# Patient Record
Sex: Female | Born: 1955 | Race: Black or African American | Hispanic: No | Smoking: Never smoker
Health system: Southern US, Community
[De-identification: ages and names within clinical notes are randomized; demographics above are authoritative.]

## PROBLEM LIST (undated history)

## (undated) DIAGNOSIS — F329 Major depressive disorder, single episode, unspecified: Secondary | ICD-10-CM

## (undated) DIAGNOSIS — F410 Panic disorder [episodic paroxysmal anxiety] without agoraphobia: Secondary | ICD-10-CM

## (undated) DIAGNOSIS — R42 Dizziness and giddiness: Secondary | ICD-10-CM

## (undated) DIAGNOSIS — K635 Polyp of colon: Secondary | ICD-10-CM

## (undated) DIAGNOSIS — Z8619 Personal history of other infectious and parasitic diseases: Secondary | ICD-10-CM

## (undated) DIAGNOSIS — F32A Depression, unspecified: Secondary | ICD-10-CM

## (undated) DIAGNOSIS — D649 Anemia, unspecified: Secondary | ICD-10-CM

## (undated) HISTORY — DX: Personal history of other infectious and parasitic diseases: Z86.19

## (undated) HISTORY — DX: Anemia, unspecified: D64.9

## (undated) HISTORY — PX: COLONOSCOPY: SHX174

## (undated) HISTORY — DX: Panic disorder (episodic paroxysmal anxiety): F41.0

## (undated) HISTORY — DX: Depression, unspecified: F32.A

## (undated) HISTORY — DX: Major depressive disorder, single episode, unspecified: F32.9

## (undated) HISTORY — DX: Polyp of colon: K63.5

---

## 2005-06-12 ENCOUNTER — Emergency Department: Payer: Self-pay | Admitting: Internal Medicine

## 2005-06-12 ENCOUNTER — Other Ambulatory Visit: Payer: Self-pay

## 2006-03-01 ENCOUNTER — Ambulatory Visit: Payer: Self-pay

## 2006-04-17 ENCOUNTER — Ambulatory Visit: Payer: Self-pay

## 2006-08-27 ENCOUNTER — Emergency Department (HOSPITAL_COMMUNITY): Admission: EM | Admit: 2006-08-27 | Discharge: 2006-08-27 | Payer: Self-pay | Admitting: Emergency Medicine

## 2008-01-16 HISTORY — PX: OTHER SURGICAL HISTORY: SHX169

## 2008-09-09 ENCOUNTER — Emergency Department: Payer: Self-pay | Admitting: Emergency Medicine

## 2008-10-11 ENCOUNTER — Ambulatory Visit: Payer: Self-pay | Admitting: Gastroenterology

## 2008-10-11 LAB — HM COLONOSCOPY

## 2008-10-12 ENCOUNTER — Ambulatory Visit: Payer: Self-pay | Admitting: Obstetrics and Gynecology

## 2010-10-30 LAB — WET PREP, GENITAL
Trich, Wet Prep: NONE SEEN
Yeast Wet Prep HPF POC: NONE SEEN

## 2010-10-30 LAB — COMPREHENSIVE METABOLIC PANEL
Alkaline Phosphatase: 36 — ABNORMAL LOW
BUN: 5 — ABNORMAL LOW
Calcium: 9
Creatinine, Ser: 0.77
Glucose, Bld: 81
Potassium: 3.9
Total Protein: 7

## 2010-10-30 LAB — CBC
HCT: 34.7 — ABNORMAL LOW
Hemoglobin: 11.3 — ABNORMAL LOW
MCHC: 32.5
MCV: 77 — ABNORMAL LOW
RDW: 16.1 — ABNORMAL HIGH

## 2010-10-30 LAB — URINALYSIS, ROUTINE W REFLEX MICROSCOPIC
Bilirubin Urine: NEGATIVE
Glucose, UA: NEGATIVE
Ketones, ur: NEGATIVE
pH: 5.5

## 2010-10-30 LAB — DIFFERENTIAL
Basophils Relative: 1
Lymphocytes Relative: 53 — ABNORMAL HIGH
Monocytes Relative: 8
Neutro Abs: 1.4 — ABNORMAL LOW
Neutrophils Relative %: 37 — ABNORMAL LOW

## 2010-10-30 LAB — GC/CHLAMYDIA PROBE AMP, GENITAL: GC Probe Amp, Genital: NEGATIVE

## 2011-03-16 HISTORY — PX: OTHER SURGICAL HISTORY: SHX169

## 2012-03-17 HISTORY — PX: OTHER SURGICAL HISTORY: SHX169

## 2012-04-23 ENCOUNTER — Ambulatory Visit: Payer: Self-pay | Admitting: Family Medicine

## 2012-07-25 ENCOUNTER — Ambulatory Visit: Payer: Self-pay | Admitting: Emergency Medicine

## 2012-07-28 ENCOUNTER — Ambulatory Visit: Payer: Self-pay | Admitting: Emergency Medicine

## 2012-08-21 ENCOUNTER — Other Ambulatory Visit: Payer: Self-pay | Admitting: Urgent Care

## 2012-10-23 ENCOUNTER — Ambulatory Visit: Payer: Self-pay | Admitting: Physician Assistant

## 2014-07-23 ENCOUNTER — Emergency Department
Admission: EM | Admit: 2014-07-23 | Discharge: 2014-07-23 | Disposition: A | Discharge status: Home / Self Care | Payer: BLUE CROSS/BLUE SHIELD | Attending: Emergency Medicine | Admitting: Emergency Medicine

## 2014-07-23 ENCOUNTER — Other Ambulatory Visit: Payer: Self-pay

## 2014-07-23 DIAGNOSIS — M545 Low back pain, unspecified: Secondary | ICD-10-CM

## 2014-07-23 LAB — COMPREHENSIVE METABOLIC PANEL
ALT: 20 U/L (ref 14–54)
ANION GAP: 9 (ref 5–15)
AST: 23 U/L (ref 15–41)
Albumin: 3.8 g/dL (ref 3.5–5.0)
Alkaline Phosphatase: 53 U/L (ref 38–126)
BUN: 11 mg/dL (ref 6–20)
CHLORIDE: 104 mmol/L (ref 101–111)
CO2: 25 mmol/L (ref 22–32)
Calcium: 8.7 mg/dL — ABNORMAL LOW (ref 8.9–10.3)
Creatinine, Ser: 0.69 mg/dL (ref 0.44–1.00)
GFR calc non Af Amer: >60 mL/min (ref >60)
GLUCOSE: 96 mg/dL (ref 65–99)
POTASSIUM: 3.8 mmol/L (ref 3.5–5.1)
Sodium: 138 mmol/L (ref 135–145)
Total Bilirubin: 0.4 mg/dL (ref 0.3–1.2)
Total Protein: 7.4 g/dL (ref 6.5–8.1)

## 2014-07-23 LAB — URINALYSIS COMPLETE WITH MICROSCOPIC (ARMC ONLY)
BACTERIA UA: NONE SEEN
BILIRUBIN URINE: NEGATIVE
GLUCOSE, UA: NEGATIVE mg/dL
HGB URINE DIPSTICK: NEGATIVE
Ketones, ur: NEGATIVE mg/dL
Leukocytes, UA: NEGATIVE
NITRITE: NEGATIVE
Protein, ur: NEGATIVE mg/dL
Specific Gravity, Urine: 1.015 (ref 1.005–1.030)
pH: 5 (ref 5.0–8.0)

## 2014-07-23 LAB — CBC WITH DIFFERENTIAL/PLATELET
BASOS ABS: 0 10*3/uL (ref 0–0.1)
BASOS PCT: 0 %
EOS ABS: 0 10*3/uL (ref 0–0.7)
Eosinophils Relative: 0 %
HCT: 36.9 % (ref 35.0–47.0)
Hemoglobin: 11.7 g/dL — ABNORMAL LOW (ref 12.0–16.0)
Lymphocytes Relative: 37 %
Lymphs Abs: 1.6 10*3/uL (ref 1.0–3.6)
MCH: 23.7 pg — AB (ref 26.0–34.0)
MCHC: 31.7 g/dL — AB (ref 32.0–36.0)
MCV: 74.7 fL — AB (ref 80.0–100.0)
MONOS PCT: 6 %
Monocytes Absolute: 0.3 10*3/uL (ref 0.2–0.9)
NEUTROS PCT: 57 %
Neutro Abs: 2.4 10*3/uL (ref 1.4–6.5)
PLATELETS: 249 10*3/uL (ref 150–440)
RBC: 4.94 MIL/uL (ref 3.80–5.20)
RDW: 16.5 % — ABNORMAL HIGH (ref 11.5–14.5)
WBC: 4.3 10*3/uL (ref 3.6–11.0)

## 2014-07-23 LAB — LIPASE, BLOOD: Lipase: 28 U/L (ref 22–51)

## 2014-07-23 LAB — TROPONIN I

## 2014-07-23 MED ORDER — NAPROXEN 500 MG PO TABS: 500.0000 mg | ORAL_TABLET | Freq: Two times a day (BID) | ORAL | Status: DC

## 2014-07-23 MED ORDER — KETOROLAC TROMETHAMINE 30 MG/ML IJ SOLN
INTRAMUSCULAR | Status: AC
  Administered 2014-07-23: 30 mg via INTRAMUSCULAR
  Filled 2014-07-23: qty 1

## 2014-07-23 NOTE — Discharge Instructions (Signed)

## 2014-07-23 NOTE — ED Notes (Signed)
Pt states that 2 days ago she started with abd pain and chest pain with some tingling to the left arm. Pt states that now she is having lower back pain at her belt line, states that she has back pain flare ups and just started a new job and thinks the pain maybe related to that. Pt states that she cont to have belly pain, that it is sore from where it was hurting earlier

## 2014-07-23 NOTE — ED Provider Notes (Signed)
[facility] Emergency Department Provider Note  ____________________________________________  Time seen: 4:45 PM  I have reviewed the triage vital signs and the nursing notes.   HISTORY  Chief Complaint Back pain   HPI Norma Bryant is a 59 y.o. female who presents with back pain. She reports 4 days ago she had discomfort in her abdomen which is now resolved. She blames the moderate aching in her lower backs on lifting heavy items at work. She thinks she has exacerbated her scoliosis and which is causing her back pain. She reports typically gets better after resting for a few days but it has not this time. She denies weakness or tingling in her lower extremities. No fevers no chills. She has no abdominal pain. She has no diarrhea. She has no incontinence. No IV drug abuse. No history of AAA     No past medical history on file.  There are no active problems to display for this patient.   No past surgical history on file.  Current Outpatient Rx  Name  Route  Sig  Dispense  Refill  . acetaminophen (TYLENOL) 325 MG tablet   Oral   Take 650 mg by mouth every 6 (six) hours as needed.           Allergies Review of patient's allergies indicates no known allergies.  No family history on file.  Social History History  Substance Use Topics  . Smoking status: Not on file  . Smokeless tobacco: Not on file  . Alcohol Use: Not on file   no smoking, no alcohol  Review of Systems  Constitutional: Negative for fever. Eyes: Negative for visual changes. ENT: Negative for sore throat Cardiovascular: Patient denies chest pain Respiratory: Negative for shortness of breath. Gastrointestinal: Negative for abdominal pain, vomiting and diarrhea. Genitourinary: Negative for dysuria. Negative for frequency Musculoskeletal: Positive for back pain Skin: Negative for rash. Neurological: Negative for headaches or focal weakness   10-point ROS otherwise  negative.  ____________________________________________   PHYSICAL EXAM:  VITAL SIGNS: ED Triage Vitals  Enc Vitals Group     BP 07/23/14 1419 141/91 mmHg     Pulse Rate 07/23/14 1419 71     Resp 07/23/14 1419 18     Temp 07/23/14 1419 98.5 F (36.9 C)     Temp Source 07/23/14 1419 Oral     SpO2 07/23/14 1419 97 %     Weight 07/23/14 1419 208 lb (94.348 kg)     Height 07/23/14 1419 5\' 4"  (1.626 m)     Head Cir --      Peak Flow --      Pain Score 07/23/14 1420 9     Pain Loc --      Pain Edu? --      Excl. in [place]? --      Constitutional: Alert and oriented. Well appearing and in no distress. Eyes: Conjunctivae are normal.  ENT   Head: Normocephalic and atraumatic.   Mouth/Throat: Mucous membranes are moist. Cardiovascular: Normal rate, regular rhythm. Normal and symmetric distal pulses are present in all extremities. No murmurs, rubs, or gallops. Respiratory: Normal respiratory effort without tachypnea nor retractions. Breath sounds are clear and equal bilaterally.  Gastrointestinal: Soft and non-tender in all quadrants. No distention. There is no CVA tenderness. No pulsatile mass, no bruit Genitourinary: deferred Musculoskeletal: Nontender with normal range of motion in all extremities. No lower extremity tenderness nor edema. No vertebral tenderness. Left-sided inferior paraspinal tenderness to palpation consistent with  muscle spasm. No erythema or bruising Neurologic:  Normal speech and language. No gross focal neurologic deficits are appreciated. Skin:  Skin is warm, dry and intact. No rash noted. Psychiatric: Mood and affect are normal. Patient exhibits appropriate insight and judgment.  ____________________________________________    LABS (pertinent positives/negatives)  Labs Reviewed  CBC WITH DIFFERENTIAL/PLATELET - Abnormal; Notable for the following:    Hemoglobin 11.7 (*)    MCV 74.7 (*)    MCH 23.7 (*)    MCHC 31.7 (*)    RDW 16.5 (*)    All  other components within normal limits  COMPREHENSIVE METABOLIC PANEL - Abnormal; Notable for the following:    Calcium 8.7 (*)    All other components within normal limits  LIPASE, BLOOD  TROPONIN I  URINALYSIS COMPLETEWITH MICROSCOPIC ([facility] ONLY)    ____________________________________________   EKG  ED ECG REPORT I, Lavonia Drafts, the attending physician, personally viewed and interpreted this ECG.  Date: 07/23/2014 EKG Time: 3:04 PM Rate: 60 Rhythm: normal sinus rhythm QRS Axis: normal Intervals: normal ST/T Wave abnormalities: normal Conduction Disutrbances: none Narrative Interpretation: unremarkable   ____________________________________________    RADIOLOGY I have personally reviewed any xrays that were ordered on this patient: None  ____________________________________________   PROCEDURES  Procedure(s) performed: none  Critical Care performed: none  ____________________________________________   INITIAL IMPRESSION / ASSESSMENT AND PLAN / ED COURSE  Pertinent labs & imaging results that were available during my care of the patient were reviewed by me and considered in my medical decision making (see chart for details).  Patient well-appearing, no acute distress. No fevers no chills. No abdominal tenderness to palpation. Exam is consistent with muscle spasm and in fact the patient moves or bends she complains of pain in her left lower back. We will treat with Toradol IM and also check a urinalysis. Her labs are unremarkable  ----------------------------------------- 7:01 PM on 07/23/2014 -----------------------------------------  Patient reports feeling significant better. Ambulated in the room easily. She reports she has a history of chronic back pain that she must have a flare. I have given her return cautions and have asked her to follow up with her primary care physician. We will start her on  NSAIDs  ____________________________________________   FINAL CLINICAL IMPRESSION(S) / ED DIAGNOSES  Final diagnoses:  Left-sided low back pain without sciatica     Lavonia Drafts, MD 07/23/14 1901

## 2015-02-17 DIAGNOSIS — IMO0002 Reserved for concepts with insufficient information to code with codable children: Secondary | ICD-10-CM | POA: Insufficient documentation

## 2015-02-17 DIAGNOSIS — F329 Major depressive disorder, single episode, unspecified: Secondary | ICD-10-CM | POA: Insufficient documentation

## 2015-02-17 DIAGNOSIS — M778 Other enthesopathies, not elsewhere classified: Secondary | ICD-10-CM | POA: Insufficient documentation

## 2015-02-17 DIAGNOSIS — Z86010 Personal history of colonic polyps: Secondary | ICD-10-CM | POA: Insufficient documentation

## 2015-02-17 DIAGNOSIS — F32A Depression, unspecified: Secondary | ICD-10-CM | POA: Insufficient documentation

## 2015-02-17 DIAGNOSIS — F410 Panic disorder [episodic paroxysmal anxiety] without agoraphobia: Secondary | ICD-10-CM | POA: Insufficient documentation

## 2015-02-17 DIAGNOSIS — D649 Anemia, unspecified: Secondary | ICD-10-CM | POA: Insufficient documentation

## 2015-02-17 DIAGNOSIS — D219 Benign neoplasm of connective and other soft tissue, unspecified: Secondary | ICD-10-CM | POA: Insufficient documentation

## 2015-02-18 ENCOUNTER — Encounter: Payer: Self-pay | Admitting: Family Medicine

## 2015-02-18 ENCOUNTER — Ambulatory Visit (INDEPENDENT_AMBULATORY_CARE_PROVIDER_SITE_OTHER): Payer: BLUE CROSS/BLUE SHIELD | Admitting: Family Medicine

## 2015-02-18 VITALS — BP 142/80 | HR 62 | Temp 97.8°F | Resp 16 | Ht 64.0 in | Wt 233.0 lb

## 2015-02-18 DIAGNOSIS — M545 Low back pain, unspecified: Secondary | ICD-10-CM | POA: Insufficient documentation

## 2015-02-18 DIAGNOSIS — M5442 Lumbago with sciatica, left side: Secondary | ICD-10-CM | POA: Diagnosis not present

## 2015-02-18 DIAGNOSIS — R635 Abnormal weight gain: Secondary | ICD-10-CM

## 2015-02-18 DIAGNOSIS — F329 Major depressive disorder, single episode, unspecified: Secondary | ICD-10-CM | POA: Diagnosis not present

## 2015-02-18 DIAGNOSIS — D649 Anemia, unspecified: Secondary | ICD-10-CM

## 2015-02-18 DIAGNOSIS — F32A Depression, unspecified: Secondary | ICD-10-CM

## 2015-02-18 MED ORDER — SERTRALINE HCL 25 MG PO TABS: ORAL_TABLET | ORAL | Status: DC

## 2015-02-18 MED ORDER — MELOXICAM 15 MG PO TABS: 15.0000 mg | ORAL_TABLET | Freq: Every day | ORAL | Status: DC | PRN

## 2015-02-18 NOTE — Progress Notes (Addendum)
Patient: Norma Bryant Female    DOB: 12/18/1955   60 y.o.   MRN: [id number] Visit Date: 02/18/2015  Today's Provider: Lelon Huh, MD   Chief Complaint  Patient presents with  . Back Pain    x 7 months   Subjective:    Back Pain This is a recurrent problem. Episode onset: 7 months ago. The problem occurs intermittently. The problem has been gradually worsening since onset. The pain is present in the lumbar spine. The quality of the pain is described as aching (sometimes a sharp pain). The pain radiates to the left thigh. The pain is moderate. The symptoms are aggravated by bending (also when lifting). Associated symptoms include leg pain and weakness (in legs from the knee down). Pertinent negatives include no abdominal pain, chest pain, dysuria or fever. She has tried bed rest and NSAIDs for the symptoms. The treatment provided moderate relief.   Patient was seen in the ER in 07/2014 for the same Back pain. Patient was given aToradol IM injection and treated with NSAIDS. Patient states she pain is not better today and it has worsened in the past few weeks.  Patient states 2 years ago she fell off a ladder and has had intermittent pain in her lower back ever since. She reports she had an MRI at that time showing scoliosis.   She also reports she hardly eats anything, but continues to gain weight. She does not get much exercise as she is limited by her back pain.   Depression: Patient states her Depression is worsening in the past few months. Patient feels stressed out and has a lot going on in her personal life. Patient currently does not take any medication for Depression but reports she has been prescribed medication several years ago. Her mood has been labile, sometimes very anxious and others depressed and crying. She is to the point she doesn't feel she can function at work due to stress. She is interested in trying another antidepressant.      No Known Allergies Previous  Medications   ACETAMINOPHEN (TYLENOL) 325 MG TABLET    Take 650 mg by mouth every 6 (six) hours as needed.   IBUPROFEN (ADVIL,MOTRIN) 800 MG TABLET    Take 800 mg by mouth every 6 (six) hours as needed.   NAPROXEN (NAPROSYN) 500 MG TABLET    Take 1 tablet (500 mg total) by mouth 2 (two) times daily with a meal.    Review of Systems  Constitutional: Positive for unexpected weight change. Negative for fever, chills, appetite change and fatigue.  Respiratory: Positive for cough. Negative for chest tightness and shortness of breath.   Cardiovascular: Positive for leg swelling (in left leg). Negative for chest pain and palpitations.  Gastrointestinal: Positive for diarrhea. Negative for nausea, vomiting and abdominal pain.  Genitourinary: Negative for dysuria, frequency, flank pain, enuresis and genital sores.  Musculoskeletal: Positive for back pain and arthralgias.  Neurological: Positive for weakness (in legs from the knee down). Negative for dizziness.  Psychiatric/Behavioral: Positive for decreased concentration.    Social History  Substance Use Topics  . Smoking status: Never Smoker   . Smokeless tobacco: Not on file  . Alcohol Use: No   Objective:   BP 142/80 mmHg  Pulse 62  Temp(Src) 97.8 F (36.6 C) (Oral)  Resp 16  Ht 5\' 4"  (1.626 m)  Wt 233 lb (105.688 kg)  BMI 39.97 kg/m2  SpO2 98%  Physical Exam   General  Appearance:    Alert, cooperative, no distress  Psych   Emotionally labile, often crying. Appropriate reasoning and judgement. Some psychomotor retardation noted.   Neurologic:   Awake, alert, oriented x 3. No apparent focal neurological           defect.   MS:     Moderate tenderness over axial lumbar spine. Negative straight leg test.        Assessment & Plan:     1. Midline low back pain with left-sided sciatica Likely exacerbated by weight gain and depression. Will send for records of previous MRI - meloxicam (MOBIC) 15 MG tablet; Take 1 tablet (15 mg  total) by mouth daily as needed for pain. Take with food  Dispense: 30 tablet; Refill: 1  2. Depression Start SSRI. She is interested in starting counseling.  - sertraline (ZOLOFT) 25 MG tablet; 1/2 tablet daily for 6 days, then 1 tablet daily for 6 days, then 2 tablets daily  Dispense: 30 tablet; Refill: 0 - Ambulatory referral to Psychology  3. Anemia, unspecified anemia type  - CBC  4. Abnormal weight gain  - Comprehensive metabolic panel - T4 AND TSH       Lelon Huh, MD  [facility]

## 2015-02-19 LAB — COMPREHENSIVE METABOLIC PANEL
ALK PHOS: 65 IU/L (ref 39–117)
ALT: 57 IU/L — ABNORMAL HIGH (ref 0–32)
AST: 35 IU/L (ref 0–40)
Albumin/Globulin Ratio: 1.7 (ref 1.1–2.5)
Albumin: 4.5 g/dL (ref 3.5–5.5)
BUN/Creatinine Ratio: 11 (ref 9–23)
BUN: 8 mg/dL (ref 6–24)
Bilirubin Total: 0.2 mg/dL (ref 0.0–1.2)
CO2: 27 mmol/L (ref 18–29)
CREATININE: 0.74 mg/dL (ref 0.57–1.00)
Calcium: 9.4 mg/dL (ref 8.7–10.2)
Chloride: 99 mmol/L (ref 96–106)
GFR calc Af Amer: 103 mL/min/{1.73_m2} (ref >59)
GFR calc non Af Amer: 90 mL/min/{1.73_m2} (ref >59)
GLOBULIN, TOTAL: 2.7 g/dL (ref 1.5–4.5)
GLUCOSE: 98 mg/dL (ref 65–99)
Potassium: 4.6 mmol/L (ref 3.5–5.2)
SODIUM: 142 mmol/L (ref 134–144)
Total Protein: 7.2 g/dL (ref 6.0–8.5)

## 2015-02-19 LAB — T4 AND TSH
T4 TOTAL: 7 ug/dL (ref 4.5–12.0)
TSH: 1.69 u[IU]/mL (ref 0.450–4.500)

## 2015-02-19 LAB — CBC
Hematocrit: 35.7 % (ref 34.0–46.6)
Hemoglobin: 11.4 g/dL (ref 11.1–15.9)
MCH: 24.1 pg — AB (ref 26.6–33.0)
MCHC: 31.9 g/dL (ref 31.5–35.7)
MCV: 75 fL — ABNORMAL LOW (ref 79–97)
NRBC: 0 % (ref 0–0)
PLATELETS: 319 10*3/uL (ref 150–379)
RBC: 4.74 x10E6/uL (ref 3.77–5.28)
RDW: 16 % — AB (ref 12.3–15.4)
WBC: 3.5 10*3/uL (ref 3.4–10.8)

## 2015-02-21 ENCOUNTER — Telehealth: Payer: Self-pay

## 2015-02-21 NOTE — Telephone Encounter (Signed)
Patient came to the office c/o of the medication that she was prescribed on 02/18/2015 (meloxicam and zoloft) is giving her diarrhea. She reports that she already has stomach issues, and one of the medications is making it worse. Advised Dr. Caryn Section of patient's symptoms, and he thinks that it could be coming from the Zoloft. He reports that it tends to have that side effect, but it will usually subside within a few days. L/M for patient to see if she could continue taking Zoloft for a few more days.

## 2015-02-21 NOTE — Telephone Encounter (Signed)
Patient reports that she will try to continue taking Zoloft for the next few days to see if symptoms subside. Patient also reports that she left a form for you to fill out for her employer up front, and she also wanted to know if you could extend her out of work note until her F/U appt with you on 03/04/15? Please advise. Thanks!

## 2015-02-22 NOTE — Telephone Encounter (Signed)
Pease advise.

## 2015-02-22 NOTE — Telephone Encounter (Signed)
Pt called back inquiring about her note.  She said she is suppose to see a therapist and wants to be out until she does that.   Thanks Con Memos

## 2015-02-23 NOTE — Telephone Encounter (Signed)
FMLA forms were completed 2-7 putting patient out through 03-04-15

## 2015-03-02 ENCOUNTER — Telehealth: Payer: Self-pay | Admitting: Family Medicine

## 2015-03-02 NOTE — Telephone Encounter (Signed)
Pt stated that she had spoke with Roshena earlier today and would like her to call back to discuss the form that was supposed to be faxed. Pt stated she didn't know what the form was for but that Roshena had called her earlier today and said she was going to fax it and she just wanted to get an update and find out what it was for. Thanks TNP

## 2015-03-02 NOTE — Telephone Encounter (Signed)
Spoke with patient and answered questions. Patient wanted to know if the forms that were completed an faxed, came from [place].

## 2015-03-03 NOTE — Telephone Encounter (Signed)
Pt would still like for you to call her back about the papers that you faxed.   Her call back is [phone]  Thanks Con Memos

## 2015-03-03 NOTE — Telephone Encounter (Signed)
Spoke with patient again. She wanted to know if she would be seeing Dr. Caryn Section tomorrow for an appointment or if it would be with someone else. Patient states she has questions about when she is to return to work because her job put her on the schedule this weekend. I advised patient that her appointment is tomorrow at 10am with Dr. Caryn Section, and she could ask him that tomorrow since he is out of the office this evening. Patient agreed and verbally voiced understanding.

## 2015-03-04 ENCOUNTER — Encounter: Payer: Self-pay | Admitting: Family Medicine

## 2015-03-04 ENCOUNTER — Ambulatory Visit (INDEPENDENT_AMBULATORY_CARE_PROVIDER_SITE_OTHER): Payer: BLUE CROSS/BLUE SHIELD | Admitting: Family Medicine

## 2015-03-04 ENCOUNTER — Ambulatory Visit
Admission: RE | Admit: 2015-03-04 | Discharge: 2015-03-04 | Disposition: A | Discharge status: Home / Self Care | Payer: BLUE CROSS/BLUE SHIELD | Source: Ambulatory Visit | Attending: Family Medicine | Admitting: Family Medicine

## 2015-03-04 VITALS — BP 130/90 | HR 74 | Temp 98.2°F | Resp 16 | Ht 64.0 in | Wt 233.0 lb

## 2015-03-04 DIAGNOSIS — M129 Arthropathy, unspecified: Secondary | ICD-10-CM | POA: Diagnosis not present

## 2015-03-04 DIAGNOSIS — M545 Low back pain, unspecified: Secondary | ICD-10-CM

## 2015-03-04 DIAGNOSIS — F32A Depression, unspecified: Secondary | ICD-10-CM

## 2015-03-04 DIAGNOSIS — D259 Leiomyoma of uterus, unspecified: Secondary | ICD-10-CM | POA: Insufficient documentation

## 2015-03-04 DIAGNOSIS — F329 Major depressive disorder, single episode, unspecified: Secondary | ICD-10-CM

## 2015-03-04 MED ORDER — SERTRALINE HCL 50 MG PO TABS: ORAL_TABLET | ORAL | Status: DC

## 2015-03-04 NOTE — Telephone Encounter (Signed)
Please advise 
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
## 2015-03-04 NOTE — Patient Instructions (Signed)
.   Go to the [facility] on [address] for low back Xray  .  

## 2015-03-04 NOTE — Telephone Encounter (Signed)
Pt stated she was in the office today and left forms for Dr. Caryn Section to complete and fax to her work place. Pt stated she was advised they would be done today and she just wanted to make sure it had been faxed. Pt stated it was her return to work information and if her job doesn't receive it today she will not be allowed to return to work on Monday. Pt would like a call back. Thanks TNP

## 2015-03-09 ENCOUNTER — Telehealth: Payer: Self-pay | Admitting: Family Medicine

## 2015-03-09 NOTE — Telephone Encounter (Signed)
Patient called back and requested results from x-ray's.

## 2015-03-09 NOTE — Telephone Encounter (Signed)
Pt would like her work note extended until she see Dr. Nicolasa Ducking on 05/26/15. Pt stated that after she saw her therapist Otila Kluver she was advised to stay out of work until her appt with Dr. Nicolasa Ducking. Pt will continue to see Otila Kluver until her appt on 05/26/15. Pt stated that it is due to her PTSD. Please advise. Pt would also, like to get her x ray results from 03/04/15. Thanks TNP

## 2015-03-17 ENCOUNTER — Ambulatory Visit: Payer: BLUE CROSS/BLUE SHIELD | Admitting: Gastroenterology

## 2015-03-25 ENCOUNTER — Ambulatory Visit: Payer: BLUE CROSS/BLUE SHIELD | Admitting: Family Medicine

## 2015-04-01 ENCOUNTER — Other Ambulatory Visit: Payer: Self-pay | Admitting: Sports Medicine

## 2015-04-01 DIAGNOSIS — M545 Low back pain: Secondary | ICD-10-CM

## 2015-04-07 ENCOUNTER — Ambulatory Visit
Admission: RE | Admit: 2015-04-07 | Discharge: 2015-04-07 | Disposition: A | Discharge status: Home / Self Care | Payer: BLUE CROSS/BLUE SHIELD | Source: Ambulatory Visit | Attending: Sports Medicine | Admitting: Sports Medicine

## 2015-04-07 DIAGNOSIS — M545 Low back pain: Secondary | ICD-10-CM

## 2015-04-15 ENCOUNTER — Encounter: Payer: Self-pay | Admitting: Family Medicine

## 2015-04-21 ENCOUNTER — Ambulatory Visit (INDEPENDENT_AMBULATORY_CARE_PROVIDER_SITE_OTHER): Payer: BLUE CROSS/BLUE SHIELD | Admitting: Gastroenterology

## 2015-04-21 ENCOUNTER — Other Ambulatory Visit: Payer: Self-pay

## 2015-04-21 ENCOUNTER — Encounter: Payer: Self-pay | Admitting: Gastroenterology

## 2015-04-21 DIAGNOSIS — M1990 Unspecified osteoarthritis, unspecified site: Secondary | ICD-10-CM | POA: Insufficient documentation

## 2015-04-21 DIAGNOSIS — R197 Diarrhea, unspecified: Secondary | ICD-10-CM

## 2015-04-21 DIAGNOSIS — R140 Abdominal distension (gaseous): Secondary | ICD-10-CM

## 2015-04-21 DIAGNOSIS — K30 Functional dyspepsia: Secondary | ICD-10-CM | POA: Insufficient documentation

## 2015-04-21 DIAGNOSIS — D509 Iron deficiency anemia, unspecified: Secondary | ICD-10-CM | POA: Diagnosis not present

## 2015-04-21 DIAGNOSIS — K219 Gastro-esophageal reflux disease without esophagitis: Secondary | ICD-10-CM | POA: Insufficient documentation

## 2015-04-21 NOTE — Progress Notes (Signed)
Primary Care Physician: Lelon Huh, MD  Primary Gastroenterologist:  Dr. Lucilla Lame  Chief Complaint  Patient presents with  . Diarrhea  . Abdominal Pain    HPI: Norma Bryant is a 60 y.o. female here for follow-up today. The patient reports that she has had chronic diarrhea and bloating with abdominal pain. The patient states that when she made the appointment she was having abdominal pain but it has since resolved. The patient denies any unexplained weight loss. The patient also reports that she has a lot of gurgling and sounds coming from her abdomen. The patient states that she has dairy products very regularly. She also reports that he has a history of colon polyps and was supposed to have a repeat colonoscopy. The patient was told she had H. pylori and states she was treated in the past but does not recall ever being retested to see if she had cleared the bacteria.  Current Outpatient Prescriptions  Medication Sig Dispense Refill  . naproxen (NAPROSYN) 500 MG tablet Take 1 tablet (500 mg total) by mouth 2 (two) times daily with a meal. 20 tablet 2  . sertraline (ZOLOFT) 50 MG tablet One tablet daily 30 tablet 1  . acetaminophen (TYLENOL) 325 MG tablet Take 650 mg by mouth every 6 (six) hours as needed. Reported on 04/21/2015    . ibuprofen (ADVIL,MOTRIN) 800 MG tablet Take 800 mg by mouth every 6 (six) hours as needed. Reported on 04/21/2015    . meloxicam (MOBIC) 15 MG tablet Take 1 tablet (15 mg total) by mouth daily as needed for pain. Take with food (Patient not taking: Reported on 04/21/2015) 30 tablet 1   No current facility-administered medications for this visit.    Allergies as of 04/21/2015  . (No Known Allergies)    ROS:  General: Negative for anorexia, weight loss, fever, chills, fatigue, weakness. ENT: Negative for hoarseness, difficulty swallowing , nasal congestion. CV: Negative for chest pain, angina, palpitations, dyspnea on exertion, peripheral edema.    Respiratory: Negative for dyspnea at rest, dyspnea on exertion, cough, sputum, wheezing.  GI: See history of present illness. GU:  Negative for dysuria, hematuria, urinary incontinence, urinary frequency, nocturnal urination.  Endo: Negative for unusual weight change.    Physical Examination:   There were no vitals taken for this visit.  General: Well-nourished, well-developed in no acute distress.  Eyes: No icterus. Conjunctivae pink. Mouth: Oropharyngeal mucosa moist and pink , no lesions erythema or exudate. Lungs: Clear to auscultation bilaterally. Non-labored. Heart: Regular rate and rhythm, no murmurs rubs or gallops.  Abdomen: Bowel sounds are normal, nontender, nondistended, no hepatosplenomegaly or masses, no abdominal bruits or hernia , no rebound or guarding.   Extremities: No lower extremity edema. No clubbing or deformities. Neuro: Alert and oriented x 3.  Grossly intact. Skin: Warm and dry, no jaundice.   Psych: Alert and cooperative, normal mood and affect.  Labs:    Imaging Studies: Mr Lumbar Spine Wo Contrast  04/08/2015  CLINICAL DATA:  Low back pain without sciatica. EXAM: MRI LUMBAR SPINE WITHOUT CONTRAST TECHNIQUE: Multiplanar, multisequence MR imaging of the lumbar spine was performed. No intravenous contrast was administered. COMPARISON:  None. FINDINGS: As requested, the scan extended from T9 through the sacrum. Normal alignment. Negative for fracture. No mass lesion. No compression of the left lower thoracic spinal cord. Conus medullaris normal and terminates at L1-2. Bilateral renal cysts. T11-12: Mild disc degeneration T12-L1:  Mild disc degeneration L1-2:  Negative L2-3:  Negative  L3-4: Mild disc and facet degeneration without spinal or foraminal stenosis. L4-5: Mild disc and facet degeneration without spinal or foraminal stenosis L5-S1:  Mild facet degeneration without stenosis. IMPRESSION: Negative for fracture in the lower thoracic or lumbar spine Mild lumbar  degenerative change without disc protrusion or spinal stenosis. Electronically Signed   By: Franchot Gallo M.D.   On: 04/08/2015 08:35    Assessment and Plan:   Norma Bryant is a 60 y.o. y/o female comes in today with a history of H. pylori and reports that she is in need of a repeat colonoscopy due to polyps. The patient also has bloating with diarrhea. The patient will be set up for a H. pylori stool test and will be set up for repeat colonoscopy. She has also been told to avoid dairy products for 2 weeks to see if her symptoms improve.I have discussed risks & benefits which include, but are not limited to, bleeding, infection, perforation & drug reaction.  The patient agrees with this plan & written consent will be obtained.      Note: This dictation was prepared with Dragon dictation along with smaller phrase technology. Any transcriptional errors that result from this process are unintentional.

## 2015-04-22 ENCOUNTER — Other Ambulatory Visit: Payer: Self-pay

## 2015-04-22 LAB — CBC WITH DIFFERENTIAL/PLATELET
BASOS ABS: 0 10*3/uL (ref 0.0–0.2)
BASOS: 0 %
EOS (ABSOLUTE): 0.1 10*3/uL (ref 0.0–0.4)
Eos: 2 %
HEMOGLOBIN: 11.2 g/dL (ref 11.1–15.9)
Hematocrit: 36.3 % (ref 34.0–46.6)
IMMATURE GRANS (ABS): 0 10*3/uL (ref 0.0–0.1)
Immature Granulocytes: 0 %
LYMPHS ABS: 2 10*3/uL (ref 0.7–3.1)
Lymphs: 46 %
MCH: 23.3 pg — ABNORMAL LOW (ref 26.6–33.0)
MCHC: 30.9 g/dL — AB (ref 31.5–35.7)
MCV: 76 fL — ABNORMAL LOW (ref 79–97)
MONOS ABS: 0.4 10*3/uL (ref 0.1–0.9)
Monocytes: 9 %
NEUTROS ABS: 1.8 10*3/uL (ref 1.4–7.0)
Neutrophils: 43 %
PLATELETS: 295 10*3/uL (ref 150–379)
RBC: 4.81 x10E6/uL (ref 3.77–5.28)
RDW: 16.4 % — AB (ref 12.3–15.4)
WBC: 4.3 10*3/uL (ref 3.4–10.8)

## 2015-04-22 LAB — IRON AND TIBC
IRON: 39 ug/dL (ref 27–159)
Iron Saturation: 10 % — ABNORMAL LOW (ref 15–55)
Total Iron Binding Capacity: 401 ug/dL (ref 250–450)
UIBC: 362 ug/dL (ref 131–425)

## 2015-04-27 ENCOUNTER — Encounter: Payer: Self-pay | Admitting: *Deleted

## 2015-04-27 ENCOUNTER — Other Ambulatory Visit: Payer: Self-pay | Admitting: *Deleted

## 2015-04-27 DIAGNOSIS — F329 Major depressive disorder, single episode, unspecified: Secondary | ICD-10-CM

## 2015-04-27 DIAGNOSIS — F32A Depression, unspecified: Secondary | ICD-10-CM

## 2015-04-27 MED ORDER — SERTRALINE HCL 50 MG PO TABS: ORAL_TABLET | ORAL | Status: DC

## 2015-04-27 NOTE — Telephone Encounter (Signed)
Refill request for sertraline hcl 50 mg, x1 tab qd Last filled by MD on- 03/04/2015 #30 x1 refill Last Appt: 03/04/2015 Next Appt: none Please advise refill?   Requesting 90 day supply?

## 2015-04-28 ENCOUNTER — Telehealth: Payer: Self-pay

## 2015-04-28 ENCOUNTER — Encounter: Payer: Self-pay | Admitting: Gastroenterology

## 2015-04-28 ENCOUNTER — Encounter: Payer: Self-pay | Admitting: Anesthesiology

## 2015-04-28 LAB — H. PYLORI ANTIGEN, STOOL: H pylori Ag, Stl: NEGATIVE

## 2015-04-28 NOTE — Telephone Encounter (Signed)
-----   Message from Lucilla Lame, MD sent at 04/25/2015 12:13 PM EDT ----- Let the patient know the blood count is normal but her iron levels are low.

## 2015-04-28 NOTE — Telephone Encounter (Signed)
Pt notified of lab results

## 2015-05-02 ENCOUNTER — Other Ambulatory Visit: Payer: Self-pay | Admitting: Family Medicine

## 2015-05-02 ENCOUNTER — Other Ambulatory Visit: Payer: Self-pay | Admitting: *Deleted

## 2015-05-02 DIAGNOSIS — F329 Major depressive disorder, single episode, unspecified: Secondary | ICD-10-CM

## 2015-05-02 DIAGNOSIS — F32A Depression, unspecified: Secondary | ICD-10-CM

## 2015-05-02 MED ORDER — SERTRALINE HCL 50 MG PO TABS: ORAL_TABLET | ORAL | Status: DC

## 2015-05-02 NOTE — Telephone Encounter (Signed)
Requesting 90 day supply.

## 2015-05-03 ENCOUNTER — Telehealth: Payer: Self-pay | Admitting: Gastroenterology

## 2015-05-03 NOTE — Telephone Encounter (Signed)
Patient called and stated she cancelled her colonoscopy and would like you to call her and reschedule.

## 2015-05-03 NOTE — Telephone Encounter (Signed)
LVM for pt to return my call to reschedule colonoscopy.  

## 2015-05-04 NOTE — Discharge Instructions (Signed)

## 2015-05-05 ENCOUNTER — Ambulatory Visit
Admission: RE | Admit: 2015-05-05 | Payer: BLUE CROSS/BLUE SHIELD | Source: Ambulatory Visit | Admitting: Gastroenterology

## 2015-05-05 HISTORY — DX: Dizziness and giddiness: R42

## 2015-05-05 SURGERY — COLONOSCOPY WITH PROPOFOL
Anesthesia: Choice

## 2015-11-16 ENCOUNTER — Encounter (INDEPENDENT_AMBULATORY_CARE_PROVIDER_SITE_OTHER): Payer: Self-pay | Admitting: Sports Medicine

## 2015-11-16 ENCOUNTER — Ambulatory Visit (INDEPENDENT_AMBULATORY_CARE_PROVIDER_SITE_OTHER): Payer: BLUE CROSS/BLUE SHIELD

## 2015-11-16 ENCOUNTER — Ambulatory Visit (INDEPENDENT_AMBULATORY_CARE_PROVIDER_SITE_OTHER): Payer: BLUE CROSS/BLUE SHIELD | Admitting: Sports Medicine

## 2015-11-16 VITALS — BP 132/95 | HR 63 | Ht 64.0 in | Wt 224.0 lb

## 2015-11-16 DIAGNOSIS — G8929 Other chronic pain: Secondary | ICD-10-CM

## 2015-11-16 DIAGNOSIS — F4310 Post-traumatic stress disorder, unspecified: Secondary | ICD-10-CM | POA: Diagnosis not present

## 2015-11-16 DIAGNOSIS — M542 Cervicalgia: Secondary | ICD-10-CM

## 2015-11-16 DIAGNOSIS — M545 Low back pain: Secondary | ICD-10-CM

## 2015-11-16 MED ORDER — TRAMADOL HCL 50 MG PO TABS: 50.0000 mg | ORAL_TABLET | Freq: Four times a day (QID) | ORAL | 0 refills | Status: DC | PRN

## 2015-11-16 MED ORDER — GABAPENTIN 300 MG PO CAPS: ORAL_CAPSULE | ORAL | 1 refills | Status: DC

## 2015-11-16 NOTE — Progress Notes (Signed)
Norma Bryant - 60 y.o. female MRN [id number]  Date of birth: May 11, 1955  Office Visit Note: Visit Date: 11/16/2015 PCP: Lelon Huh, MD Referred by: Birdie Sons, MD  Subjective: Chief Complaint  Patient presents with  . Middle Back - Pain  . Left shoulder pain   HPI:  Patient states middle back pain on the right side and left shoulder pain.  Pain for about 4 weeks. Hurts to turn right to left and to do anything.    Pain radiates down into the right upper extremity greater than left. Pain with any type of cervical rotation or flexion/ extension.  Denies fevers, chills, recent weight gain or weight loss.  No night sweats. No significant nighttime awakenings due to this issue.  Pt denies any change in bowel or bladder habits, muscle weakness, numbness or falls associated with this pain.    ROS Otherwise per HPI.  Assessment & Plan: Visit Diagnoses:  1. Neck pain   2. PTSD (post-traumatic stress disorder)   3. Chronic midline low back pain without sciatica     Plan: Findings:  Symptoms consistent with bilateral radiculitis. Given underlying anxiety issues as well we'll trial of gabapentin & titrate up slowly. AAOS spine conditioning program provided.    Meds & Orders:  Meds ordered this encounter  Medications  . gabapentin (NEURONTIN) 300 MG capsule    Sig: Start with 1 tab po qhs X 1 week, then increase to 1 tab po bid X 1 week then 1 tab po prn    Dispense:  90 capsule    Refill:  1  . traMADol (ULTRAM) 50 MG tablet    Sig: Take 1 tablet (50 mg total) by mouth every 6 (six) hours as needed.    Dispense:  30 tablet    Refill:  0    Orders Placed This Encounter  Procedures  . XR Cervical Spine 2 or 3 views  . Ambulatory referral to Physical Therapy    Follow-up: Return in about 6 weeks (around 12/28/2015) for repeat clinical exam.   Procedures: No procedures performed  No notes on file   Clinical History: MRI Lumbar Spine 04/07/15: Mild lumbar  spondylosis without disc protrusion or stenosis  She reports that she has never smoked. She has never used smokeless tobacco. No results for input(s): HGBA1C, LABURIC in the last 8760 hours.  Objective:  VS:  HT:5\' 4"  (162.6 cm)   WT:224 lb (101.6 kg)  BMI:38.5    BP:(!) 132/95  HR:63bpm  TEMP: ( )  RESP:  Physical Exam  Constitutional: No distress.  HENT:  Head: Normocephalic and atraumatic.  Eyes: Right eye exhibits no discharge. Left eye exhibits no discharge. No scleral icterus.  Pulmonary/Chest: Effort normal. No respiratory distress.  Musculoskeletal:  Neck & shoulders overall well aligned. Rotator cuff strength intact. Upper extremity strength is symmetric & normal. Negative Hoffman's. Pain with brachial plexus squeeze as well as arm squeeze test on the left. Upper extremity dermatomes intact to light touch & symmetric. Negative Spurling's. Limited cervical side bending to the right. She has pain with straight leg raise bilaterally is localizes to the posterior leg not to the back. No radicular symptoms. Lower extremity strength is intact.  Neurological: She is alert.  Appropriately interactive.  Skin: Skin is warm and dry. No rash noted. She is not diaphoretic. No erythema. No pallor.  Psychiatric: Judgment normal.    Ortho Exam Imaging: No results found.  Past Medical/Family/Surgical/Social History: Medications & Allergies reviewed per  EMR Patient Active Problem List   Diagnosis Date Noted  . Arthritis 04/21/2015  . Acid indigestion 04/21/2015  . Esophageal reflux 04/21/2015  . Low back pain 02/18/2015  . Abnormal weight gain 02/18/2015  . Anemia 02/17/2015  . History of adenomatous polyp of colon 02/17/2015  . Cystocele 02/17/2015  . Depression 02/17/2015  . Fibroids 02/17/2015  . Panic attacks 02/17/2015  . Tendinitis of elbow or forearm 02/17/2015   Past Medical History:  Diagnosis Date  . Anemia   . Colon polyp   . Depression   . History of chicken pox     . History of measles   . History of mumps   . Panic attack   . Vertigo    no episodes over 1 yr   Family History  Problem Relation Age of Onset  . Congestive Heart Failure Mother   . Heart attack Mother     1st MI in her 67's  . Cirrhosis Father     of liver  . Alcohol abuse Father   . Diabetes Maternal Aunt    Past Surgical History:  Procedure Laterality Date  . colon polyp removal  2010   colonoscopy  . COLONOSCOPY    . Mammogram Screening  03/2011   [facility]  . pap smear  03/17/2012   [facility]   Social History   Occupational History  . Retail sales associate     Works for [employer]  . Smoking status: Never Smoker  . Smokeless tobacco: Never Used  . Alcohol use No  . Drug use: No  . Sexual activity: Not on file

## 2015-11-18 ENCOUNTER — Telehealth (INDEPENDENT_AMBULATORY_CARE_PROVIDER_SITE_OTHER): Payer: Self-pay | Admitting: Sports Medicine

## 2015-11-18 NOTE — Telephone Encounter (Signed)
Patient called advised the medication is not working and she need a note to be out of work until the medicine kicks in.  The number to contact he is [phone]

## 2015-11-18 NOTE — Telephone Encounter (Signed)
Okay to provide a note to be out of work until 11/23/15.  Then return with restrictions previously provided. If she is still having severe enough symptoms to warrant being out of work at that time she will need a earlier follow-up appointment to extend this any further.

## 2015-11-18 NOTE — Telephone Encounter (Signed)
Please see message below concerning a work note for patient. Please Advise.  Thank You

## 2015-11-23 ENCOUNTER — Telehealth (INDEPENDENT_AMBULATORY_CARE_PROVIDER_SITE_OTHER): Payer: Self-pay

## 2015-11-23 ENCOUNTER — Encounter (INDEPENDENT_AMBULATORY_CARE_PROVIDER_SITE_OTHER): Payer: Self-pay

## 2015-11-23 NOTE — Telephone Encounter (Signed)
Talked with patient and advised her of message concerning work note and when she returns to work to use her previous restrictions provided.

## 2015-11-23 NOTE — Telephone Encounter (Signed)
Talked with patient and advised her that forms were being completed and would be ready for pick up at the front desk.  Stated that she was not sure if she would need forms completed.  Advised her that I would go ahead and complete forms any way.  Faxed forms to [employer] at [phone].

## 2015-11-23 NOTE — Progress Notes (Signed)
   Norma Bryant - 60 y.o. female MRN [id number]  Date of birth: 02/17/1955  Office Visit Note: Visit Date: 11/23/2015 PCP: Lelon Huh, MD Referred by: No ref. provider found  Subjective: No chief complaint on file.  HPI: HPI  ROS Otherwise per HPI.  Assessment & Plan: Visit Diagnoses: No diagnosis found.  Plan: No additional findings.   Meds & Orders: No orders of the defined types were placed in this encounter.  No orders of the defined types were placed in this encounter.   Follow-up: No Follow-up on file.   Procedures: No procedures performed  No notes on file   Clinical History: MRI Lumbar Spine 04/07/15: Mild lumbar spondylosis without disc protrusion or stenosis  She reports that she has never smoked. She has never used smokeless tobacco. No results for input(s): HGBA1C, LABURIC in the last 8760 hours.  Objective:  VS:  HT:    WT:   BMI:     BP:   HR: bpm  TEMP: ( )  RESP:  Physical Exam  Ortho Exam Imaging: No results found.  Past Medical/Family/Surgical/Social History: Medications & Allergies reviewed per EMR Patient Active Problem List   Diagnosis Date Noted  . Arthritis 04/21/2015  . Acid indigestion 04/21/2015  . Esophageal reflux 04/21/2015  . Low back pain 02/18/2015  . Abnormal weight gain 02/18/2015  . Anemia 02/17/2015  . History of adenomatous polyp of colon 02/17/2015  . Cystocele 02/17/2015  . Depression 02/17/2015  . Fibroids 02/17/2015  . Panic attacks 02/17/2015  . Tendinitis of elbow or forearm 02/17/2015   Past Medical History:  Diagnosis Date  . Anemia   . Colon polyp   . Depression   . History of chicken pox   . History of measles   . History of mumps   . Panic attack   . Vertigo    no episodes over 1 yr   Family History  Problem Relation Age of Onset  . Congestive Heart Failure Mother   . Heart attack Mother     1st MI in her 9's  . Cirrhosis Father     of liver  . Alcohol abuse Father   . Diabetes Maternal  Aunt    Past Surgical History:  Procedure Laterality Date  . colon polyp removal  2010   colonoscopy  . COLONOSCOPY    . Mammogram Screening  03/2011   [facility]  . pap smear  03/17/2012   [facility]   Social History   Occupational History  . 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 Smoking status: Never Smoker  . Smokeless tobacco: Never Used  . Alcohol use No  . Drug use: No  . Sexual activity: Not on file

## 2015-11-29 ENCOUNTER — Telehealth (INDEPENDENT_AMBULATORY_CARE_PROVIDER_SITE_OTHER): Payer: Self-pay | Admitting: Radiology

## 2015-11-29 NOTE — Telephone Encounter (Signed)
Talked with patient and she would like to know if a work note could be done without the restrictions of no bending, stooping, or twisting. Please Advise. Thank You

## 2015-11-29 NOTE — Telephone Encounter (Signed)
Okay to provided as outlined above.

## 2015-11-29 NOTE — Telephone Encounter (Signed)
Pt called back about what this letter is supposed to say.

## 2015-11-29 NOTE — Telephone Encounter (Signed)
Patient calling today wanting to speak with April to know about a letter that was going to be written about her restrictions. Can you please call her back? CB# [phone]

## 2015-11-30 ENCOUNTER — Encounter (INDEPENDENT_AMBULATORY_CARE_PROVIDER_SITE_OTHER): Payer: Self-pay

## 2015-11-30 NOTE — Telephone Encounter (Signed)
Faxed updated work note to [phone].

## 2015-12-28 ENCOUNTER — Ambulatory Visit (INDEPENDENT_AMBULATORY_CARE_PROVIDER_SITE_OTHER): Payer: BLUE CROSS/BLUE SHIELD | Admitting: Sports Medicine

## 2016-01-20 ENCOUNTER — Encounter (INDEPENDENT_AMBULATORY_CARE_PROVIDER_SITE_OTHER): Payer: Self-pay | Admitting: *Deleted

## 2016-02-08 ENCOUNTER — Telehealth: Payer: Self-pay

## 2016-02-08 NOTE — Telephone Encounter (Signed)
Patient would like to schedule a screening colonoscopy with Dr. Allen Norris

## 2016-02-12 ENCOUNTER — Other Ambulatory Visit: Payer: Self-pay | Admitting: Family Medicine

## 2016-02-12 DIAGNOSIS — F32A Depression, unspecified: Secondary | ICD-10-CM

## 2016-02-12 DIAGNOSIS — F329 Major depressive disorder, single episode, unspecified: Secondary | ICD-10-CM

## 2016-02-13 ENCOUNTER — Telehealth: Payer: Self-pay

## 2016-02-13 NOTE — Telephone Encounter (Signed)
Patient called again wanting to know when she would receive a call from the nurse to schedule her colonoscopy. I told her that her order was in the work-que but that we only have one nurse calling patients. However, I told her that the nurse would call her as soon as she can get to her. Patient understood and had no further questions.

## 2016-02-14 ENCOUNTER — Other Ambulatory Visit: Payer: Self-pay

## 2016-02-14 NOTE — Telephone Encounter (Signed)
Colonoscopy at [facility] with Vicente Males on 03/01/16. Please precert for hx of colon polyps Z86.010

## 2016-02-14 NOTE — Telephone Encounter (Signed)
Gastroenterology Pre-Procedure Review  Request Date:  Requesting Physician: Dr.   PATIENT REVIEW QUESTIONS: The patient responded to the following health history questions as indicated:    1. Are you having any GI issues? no 2. Do you have a personal history of Polyps? yes (repeat 2 years) 3. Do you have a family history of Colon Cancer or Polyps? no 4. Diabetes Mellitus? no 5. Joint replacements in the past 12 months?no 6. Major health problems in the past 3 months?no 7. Any artificial heart valves, MVP, or defibrillator?no    MEDICATIONS & ALLERGIES:    Patient reports the following regarding taking any anticoagulation/antiplatelet therapy:   Plavix, Coumadin, Eliquis, Xarelto, Lovenox, Pradaxa, Brilinta, or Effient? no Aspirin? no  Patient confirms/reports the following medications:  Current Outpatient Prescriptions  Medication Sig Dispense Refill  . gabapentin (NEURONTIN) 300 MG capsule Start with 1 tab po qhs X 1 week, then increase to 1 tab po bid X 1 week then 1 tab po prn 90 capsule 1  . meloxicam (MOBIC) 15 MG tablet TAKE 1 TABLET BY MOUTH EVERY DAY AS NEEDED FOR PAIN WITH FOOD (Patient not taking: Reported on 11/16/2015) 30 tablet 3  . Multiple Vitamins-Minerals (CENTRUM ADULTS PO) Take by mouth daily.    . naproxen (NAPROSYN) 500 MG tablet Take 1 tablet (500 mg total) by mouth 2 (two) times daily with a meal. (Patient not taking: Reported on 11/16/2015) 20 tablet 2  . sertraline (ZOLOFT) 50 MG tablet TAKE 1 TABLET BY MOUTH DAILY 90 tablet 1  . traMADol (ULTRAM) 50 MG tablet Take 1 tablet (50 mg total) by mouth every 6 (six) hours as needed. 30 tablet 0   No current facility-administered medications for this visit.     Patient confirms/reports the following allergies:  No Known Allergies  No orders of the defined types were placed in this encounter.   AUTHORIZATION INFORMATION Primary Insurance: 1D#: Group #:  Secondary Insurance: 1D#: Group #:  SCHEDULE  INFORMATION: Date: 03/01/16 Time: Location: [place]

## 2016-02-16 NOTE — Telephone Encounter (Signed)
Screening colonoscopy with Norma Bryant on 03/01/16 at [facility].

## 2016-03-01 ENCOUNTER — Ambulatory Visit: Payer: BLUE CROSS/BLUE SHIELD | Admitting: Anesthesiology

## 2016-03-01 ENCOUNTER — Ambulatory Visit
Admission: RE | Admit: 2016-03-01 | Discharge: 2016-03-01 | Disposition: A | Discharge status: Home / Self Care | Payer: BLUE CROSS/BLUE SHIELD | Source: Ambulatory Visit | Attending: Gastroenterology | Admitting: Gastroenterology

## 2016-03-01 ENCOUNTER — Encounter: Payer: Self-pay | Admitting: *Deleted

## 2016-03-01 ENCOUNTER — Encounter
Admission: RE | Disposition: A | Discharge status: Home / Self Care | Payer: Self-pay | Source: Ambulatory Visit | Attending: Gastroenterology

## 2016-03-01 DIAGNOSIS — E669 Obesity, unspecified: Secondary | ICD-10-CM | POA: Insufficient documentation

## 2016-03-01 DIAGNOSIS — Z79899 Other long term (current) drug therapy: Secondary | ICD-10-CM | POA: Diagnosis not present

## 2016-03-01 DIAGNOSIS — Z86010 Personal history of colonic polyps: Secondary | ICD-10-CM | POA: Insufficient documentation

## 2016-03-01 DIAGNOSIS — F410 Panic disorder [episodic paroxysmal anxiety] without agoraphobia: Secondary | ICD-10-CM | POA: Diagnosis not present

## 2016-03-01 DIAGNOSIS — Z6838 Body mass index (BMI) 38.0-38.9, adult: Secondary | ICD-10-CM | POA: Diagnosis not present

## 2016-03-01 DIAGNOSIS — Z1211 Encounter for screening for malignant neoplasm of colon: Secondary | ICD-10-CM | POA: Diagnosis present

## 2016-03-01 DIAGNOSIS — Z8249 Family history of ischemic heart disease and other diseases of the circulatory system: Secondary | ICD-10-CM | POA: Diagnosis not present

## 2016-03-01 DIAGNOSIS — Z8619 Personal history of other infectious and parasitic diseases: Secondary | ICD-10-CM | POA: Insufficient documentation

## 2016-03-01 DIAGNOSIS — Z833 Family history of diabetes mellitus: Secondary | ICD-10-CM | POA: Diagnosis not present

## 2016-03-01 DIAGNOSIS — D649 Anemia, unspecified: Secondary | ICD-10-CM | POA: Diagnosis not present

## 2016-03-01 DIAGNOSIS — Z811 Family history of alcohol abuse and dependence: Secondary | ICD-10-CM | POA: Insufficient documentation

## 2016-03-01 DIAGNOSIS — M1990 Unspecified osteoarthritis, unspecified site: Secondary | ICD-10-CM | POA: Diagnosis not present

## 2016-03-01 DIAGNOSIS — Z8379 Family history of other diseases of the digestive system: Secondary | ICD-10-CM | POA: Diagnosis not present

## 2016-03-01 DIAGNOSIS — D122 Benign neoplasm of ascending colon: Secondary | ICD-10-CM | POA: Diagnosis not present

## 2016-03-01 DIAGNOSIS — F329 Major depressive disorder, single episode, unspecified: Secondary | ICD-10-CM | POA: Insufficient documentation

## 2016-03-01 DIAGNOSIS — R42 Dizziness and giddiness: Secondary | ICD-10-CM | POA: Diagnosis not present

## 2016-03-01 HISTORY — PX: COLONOSCOPY WITH PROPOFOL: SHX5780

## 2016-03-01 SURGERY — COLONOSCOPY WITH PROPOFOL
Anesthesia: General

## 2016-03-01 MED ORDER — METHYLENE BLUE 0.5 % INJ SOLN
INTRAVENOUS | Status: AC
  Filled 2016-03-01: qty 20

## 2016-03-01 MED ORDER — LIDOCAINE HCL (PF) 1 % IJ SOLN
INTRAMUSCULAR | Status: AC
  Administered 2016-03-01: 0.3 mL via INTRADERMAL
  Filled 2016-03-01: qty 2

## 2016-03-01 MED ORDER — LIDOCAINE HCL (PF) 2 % IJ SOLN
INTRAMUSCULAR | Status: DC | PRN
  Administered 2016-03-01: 50 mg via INTRADERMAL

## 2016-03-01 MED ORDER — LIDOCAINE HCL (PF) 2 % IJ SOLN
INTRAMUSCULAR | Status: AC
  Filled 2016-03-01: qty 2

## 2016-03-01 MED ORDER — METHYLENE BLUE 0.5 % INJ SOLN
INTRAVENOUS | Status: DC | PRN
  Administered 2016-03-01: 1 mL via SUBMUCOSAL

## 2016-03-01 MED ORDER — PROPOFOL 10 MG/ML IV BOLUS
INTRAVENOUS | Status: DC | PRN
  Administered 2016-03-01: 60 mg via INTRAVENOUS

## 2016-03-01 MED ORDER — LIDOCAINE HCL (PF) 1 % IJ SOLN
2.0000 mL | Freq: Once | INTRAMUSCULAR | Status: AC
  Administered 2016-03-01: 0.3 mL via INTRADERMAL

## 2016-03-01 MED ORDER — PROPOFOL 500 MG/50ML IV EMUL
INTRAVENOUS | Status: DC | PRN
  Administered 2016-03-01: 120 ug/kg/min via INTRAVENOUS

## 2016-03-01 MED ORDER — EPHEDRINE SULFATE 50 MG/ML IJ SOLN
INTRAMUSCULAR | Status: AC
  Filled 2016-03-01: qty 1

## 2016-03-01 MED ORDER — PHENYLEPHRINE HCL 10 MG/ML IJ SOLN
INTRAMUSCULAR | Status: AC
  Filled 2016-03-01: qty 1

## 2016-03-01 MED ORDER — SODIUM CHLORIDE 0.9 % IV SOLN
INTRAVENOUS | Status: DC | PRN
  Administered 2016-03-01: 07:00:00 via INTRAVENOUS

## 2016-03-01 MED ORDER — SODIUM CHLORIDE 0.9 % IV SOLN
INTRAVENOUS | Status: DC
  Administered 2016-03-01: 1000 mL via INTRAVENOUS

## 2016-03-01 MED ORDER — EPHEDRINE SULFATE 50 MG/ML IJ SOLN
INTRAMUSCULAR | Status: DC | PRN
  Administered 2016-03-01: 10 mg via INTRAVENOUS

## 2016-03-01 MED ORDER — PROPOFOL 500 MG/50ML IV EMUL
INTRAVENOUS | Status: AC
  Filled 2016-03-01: qty 50

## 2016-03-01 MED ORDER — SODIUM CHLORIDE 0.9 % IV SOLN
INTRAVENOUS | Status: DC | PRN
  Administered 2016-03-01 (×2): 100 ug via INTRAVENOUS

## 2016-03-01 NOTE — H&P (Signed)
Jonathon Bellows MD [address] [place], [place] [address] Phone: [phone] Fax : [phone]  Primary Care Physician:  Lelon Huh, MD Primary Gastroenterologist:  Dr. Jonathon Bellows   Pre-Procedure History & Physical: HPI:  Norma Bryant is a 61 y.o. female is here for an colonoscopy.   Past Medical History:  Diagnosis Date  . Anemia   . Colon polyp   . Depression   . History of chicken pox   . History of measles   . History of mumps   . Panic attack   . Vertigo    no episodes over 1 yr    Past Surgical History:  Procedure Laterality Date  . colon polyp removal  2010   colonoscopy  . COLONOSCOPY    . Mammogram Screening  03/2011   [facility]  . pap smear  03/17/2012   [facility]    Prior to Admission medications   Medication Sig Start Date End Date Taking? Authorizing Provider  gabapentin (NEURONTIN) 300 MG capsule Start with 1 tab po qhs X 1 week, then increase to 1 tab po bid X 1 week then 1 tab po prn 11/16/15   Gerda Diss, DO  meloxicam (MOBIC) 15 MG tablet TAKE 1 TABLET BY MOUTH EVERY DAY AS NEEDED FOR PAIN WITH FOOD Patient not taking: Reported on 11/16/2015 05/02/15   Birdie Sons, MD  Multiple Vitamins-Minerals (CENTRUM ADULTS PO) Take by mouth daily.    Historical Provider, MD  naproxen (NAPROSYN) 500 MG tablet Take 1 tablet (500 mg total) by mouth 2 (two) times daily with a meal. Patient not taking: Reported on 11/16/2015 07/23/14   Lavonia Drafts, MD  sertraline (ZOLOFT) 50 MG tablet TAKE 1 TABLET BY MOUTH DAILY 02/13/16   Birdie Sons, MD  traMADol (ULTRAM) 50 MG tablet Take 1 tablet (50 mg total) by mouth every 6 (six) hours as needed. 11/16/15   Gerda Diss, DO    Allergies as of 02/14/2016  . (No Known Allergies)    Family History  Problem Relation Age of Onset  . Congestive Heart Failure Mother   . Heart attack Mother     1st MI in her 18's  . Cirrhosis Father     of liver  . Alcohol abuse Father   . Diabetes Maternal Aunt     Social  History   Social History  . Marital status: Divorced    Spouse name: N/A  . Number of children: 4  . Years of education: N/A   Occupational History  . 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 Smoking status: Never Smoker  . Smokeless tobacco: Never Used  . Alcohol use No  . Drug use: No  . Sexual activity: Not on file   Other Topics Concern  . Not on file   Social History Narrative  . No narrative on file    Review of Systems: See HPI, otherwise negative ROS  Physical Exam: BP (!) 137/98   Pulse 68   Temp 97 F (36.1 C) (Tympanic)   Resp 16   Ht 5\' 4"  (1.626 m)   Wt 223 lb (101.2 kg)   SpO2 100%   BMI 38.28 kg/m  General:   Alert,  pleasant and cooperative in NAD Head:  Normocephalic and atraumatic. Neck:  Supple; no masses or thyromegaly. Lungs:  Clear throughout to auscultation.    Heart:  Regular rate and rhythm. Abdomen:  Soft, nontender and nondistended. Normal bowel sounds, without guarding, and without rebound.   Neurologic:  Alert and  oriented x4;  grossly normal neurologically.  Impression/Plan: Norma Bryant is here for an colonoscopy to be performed for surveillance due to prior history of colon polyps   Risks, benefits, limitations, and alternatives regarding  colonoscopy have been reviewed with the patient.  Questions have been answered.  All parties agreeable.   Jonathon Bellows, MD  03/01/2016, 7:53 AM

## 2016-03-01 NOTE — Op Note (Signed)
[facility] Gastroenterology Patient Name: Norma Bryant Procedure Date: 03/01/2016 7:56 AM MRN: [id number] Account #: [id number] Date of Birth: March 06, 1955 Admit Type: Outpatient Age: 61 Room: [facility] ENDO ROOM 4 Gender: Female Note Status: Finalized Procedure:            Colonoscopy Indications:          Surveillance: Personal history of adenomatous polyps on                        last colonoscopy > 5 years ago, Last colonoscopy:                        September 2010 Providers:            Jonathon Bellows MD, MD Referring MD:         Kirstie Peri. Caryn Section, MD (Referring MD) Medicines:            Monitored Anesthesia Care Complications:        No immediate complications. Procedure:            Pre-Anesthesia Assessment:                       - Prior to the procedure, a History and Physical was                        performed, and patient medications, allergies and                        sensitivities were reviewed. The patient's tolerance of                        previous anesthesia was reviewed.                       - The risks and benefits of the procedure and the                        sedation options and risks were discussed with the                        patient. All questions were answered and informed                        consent was obtained.                       - The risks and benefits of the procedure and the                        sedation options and risks were discussed with the                        patient. All questions were answered and informed                        consent was obtained.                       - ASA Grade Assessment: II - A patient with mild  systemic disease.                       After obtaining informed consent, the colonoscope was                        passed under direct vision. Throughout the procedure,                        the patient's blood pressure, pulse, and oxygen   saturations were monitored continuously. The                        Colonoscope was introduced through the anus and                        advanced to the the cecum, identified by the                        appendiceal orifice, IC valve and transillumination.                        The colonoscopy was somewhat difficult due to nature of                        the polyps{skip}. [Solution]. The quality of the bowel                        preparation was excellent. Findings:      A 5 mm polyp was found in the ascending colon. The polyp was sessile.       The polyp was removed with a cold snare. Resection and retrieval were       complete.      A 12 mm polyp was found in the ascending colon. The polyp was sessile.       The polyp was removed with a hot snare. The polyp was removed with a       saline injection-lift technique using a hot snare. The polyp was removed       with a piecemeal technique using a hot snare. The polyp was removed with       a saline injection-lift technique using a cold snare. The polyp was       removed with a piecemeal technique using a cold snare. Resection and       retrieval were complete. To prevent bleeding post-intervention, one       hemostatic clip was successfully placed. There was no bleeding during       the maneuver.      A 15 mm polyp was found in the ascending colon. The polyp was flat. The       polyp was removed with a hot snare. The polyp was removed with a saline       injection-lift technique using a hot snare. Resection and retrieval were       complete. To prevent bleeding post-intervention, three hemostatic clips       were successfully placed. There was no bleeding during the maneuver.      The exam was otherwise without abnormality on direct and retroflexion       views. Impression:           - One 5 mm polyp in the ascending colon, removed with a  cold snare. Resected and retrieved.                       - One 12 mm  polyp in the ascending colon, removed using                        injection-lift and a cold snare, removed piecemeal                        using a cold snare, removed with a hot snare, removed                        using injection-lift and a hot snare and removed                        piecemeal using a hot snare. Resected and retrieved.                        Clip was placed.                       - One 15 mm polyp in the ascending colon, removed with                        a hot snare and removed using injection-lift and a hot                        snare. Resected and retrieved. Clips were placed.                       - The examination was otherwise normal on direct and                        retroflexion views. Recommendation:       - Discharge patient to home (with escort).                       - Resume previous diet today.                       - No ibuprofen, naproxen, or other non-steroidal                        anti-inflammatory drugs for 4 weeks after polyp removal. Procedure Code(s):    --- Professional ---                       [phone], Colonoscopy, flexible; with removal of tumor(s),                        polyp(s), or other lesion(s) by snare technique                       [id number], Colonoscopy, flexible; with directed submucosal                        injection(s), any substance Diagnosis Code(s):    --- Professional ---                       Z86.010, Personal history of colonic polyps  D12.2, Benign neoplasm of ascending colon CPT copyright 2016 American Medical Association. All rights reserved. The codes documented in this report are preliminary and upon coder review may  be revised to meet current compliance requirements. Jonathon Bellows, MD Jonathon Bellows MD, MD 03/01/2016 8:44:02 AM This report has been signed electronically. Number of Addenda: 0 Note Initiated On: 03/01/2016 7:56 AM Scope Withdrawal Time: 0 hours 34 minutes 37 seconds  Total Procedure  Duration: 0 hours 37 minutes 42 seconds       [facility]

## 2016-03-01 NOTE — Transfer of Care (Signed)
Immediate Anesthesia Transfer of Care Note  Patient: Norma Bryant  Procedure(s) Performed: Procedure(s): COLONOSCOPY WITH PROPOFOL (N/A)  Patient Location: Endoscopy Unit  Anesthesia Type:General  Level of Consciousness: awake and alert   Airway & Oxygen Therapy: Patient Spontanous Breathing and Patient connected to nasal cannula oxygen  Post-op Assessment: Report given to RN and Post -op Vital signs reviewed and stable  Post vital signs: Reviewed and stable  Last Vitals:  Vitals:   03/01/16 0710  BP: (!) 137/98  Pulse: 68  Resp: 16  Temp: 36.1 C    Last Pain:  Vitals:   03/01/16 0710  TempSrc: Tympanic         Complications: No apparent anesthesia complications

## 2016-03-01 NOTE — Anesthesia Preprocedure Evaluation (Signed)
Anesthesia Evaluation  Patient identified by MRN, date of birth, ID band Patient awake    Reviewed: Allergy & Precautions, NPO status , Patient's Chart, lab work & pertinent test results  History of Anesthesia Complications Negative for: history of anesthetic complications  Airway Mallampati: II  TM Distance: >3 FB Neck ROM: Full    Dental no notable dental hx.    Pulmonary neg pulmonary ROS, neg sleep apnea, neg COPD,    breath sounds clear to auscultation- rhonchi (-) wheezing      Cardiovascular Exercise Tolerance: Good (-) hypertension(-) CAD and (-) Past MI  Rhythm:Regular Rate:Normal - Systolic murmurs and - Diastolic murmurs    Neuro/Psych PSYCHIATRIC DISORDERS Anxiety Depression negative neurological ROS     GI/Hepatic Neg liver ROS, GERD  ,  Endo/Other  negative endocrine ROSneg diabetes  Renal/GU negative Renal ROS     Musculoskeletal  (+) Arthritis ,   Abdominal (+) + obese,   Peds  Hematology  (+) anemia ,   Anesthesia Other Findings Past Medical History: No date: Anemia No date: Colon polyp No date: Depression No date: History of chicken pox No date: History of measles No date: History of mumps No date: Panic attack No date: Vertigo     Comment: no episodes over 1 yr   Reproductive/Obstetrics                             Anesthesia Physical Anesthesia Plan  ASA: II  Anesthesia Plan: General   Post-op Pain Management:    Induction: Intravenous  Airway Management Planned: Natural Airway  Additional Equipment:   Intra-op Plan:   Post-operative Plan:   Informed Consent: I have reviewed the patients History and Physical, chart, labs and discussed the procedure including the risks, benefits and alternatives for the proposed anesthesia with the patient or authorized representative who has indicated his/her understanding and acceptance.   Dental advisory  given  Plan Discussed with: CRNA and Anesthesiologist  Anesthesia Plan Comments:         Anesthesia Quick Evaluation

## 2016-03-01 NOTE — Anesthesia Post-op Follow-up Note (Signed)
Anesthesia QCDR form completed.        

## 2016-03-01 NOTE — Anesthesia Postprocedure Evaluation (Signed)
Anesthesia Post Note  Patient: [name]  Procedure(s) Performed: Procedure(s) (LRB): COLONOSCOPY WITH PROPOFOL (N/A)  Patient location during evaluation: Endoscopy Anesthesia Type: General Level of consciousness: awake and alert and oriented Pain management: pain level controlled Vital Signs Assessment: post-procedure vital signs reviewed and stable Respiratory status: spontaneous breathing, nonlabored ventilation and respiratory function stable Cardiovascular status: blood pressure returned to baseline and stable Postop Assessment: no signs of nausea or vomiting Anesthetic complications: no     Last Vitals:  Vitals:   03/01/16 0843 03/01/16 0850  BP: 96/70 115/73  Pulse: 76 75  Resp: 14 (!) 62  Temp: (!) 35.6 C     Last Pain:  Vitals:   03/01/16 0843  TempSrc: Tympanic                 Roxas Clymer

## 2016-03-02 ENCOUNTER — Encounter: Payer: Self-pay | Admitting: Gastroenterology

## 2016-03-02 LAB — SURGICAL PATHOLOGY

## 2016-03-02 NOTE — Procedures (Signed)
Pt peeing blue. I told patient it is from dye used during procedure.DR A nna aware.

## 2016-03-08 ENCOUNTER — Telehealth: Payer: Self-pay

## 2016-03-08 NOTE — Telephone Encounter (Signed)
Advised pt of results and set reminder for 6 month follow-up colonoscopy.

## 2016-03-08 NOTE — Telephone Encounter (Signed)
-----   Message from Glennie Isle, [place] sent at 03/08/2016 10:31 AM EST -----   ----- Message ----- From: Jonathon Bellows, MD Sent: 03/06/2016  10:56 AM To: Glennie Isle, CMA  Multiple polyps- resected piece meal - needs repeat colonoscopy in 6 months

## 2016-03-08 NOTE — Telephone Encounter (Signed)
LVM for patient to callback [phone].

## 2016-05-18 ENCOUNTER — Emergency Department
Admission: EM | Admit: 2016-05-18 | Discharge: 2016-05-18 | Disposition: A | Discharge status: Home / Self Care | Payer: BLUE CROSS/BLUE SHIELD | Attending: Emergency Medicine | Admitting: Emergency Medicine

## 2016-05-18 ENCOUNTER — Encounter: Payer: Self-pay | Admitting: Emergency Medicine

## 2016-05-18 DIAGNOSIS — R55 Syncope and collapse: Secondary | ICD-10-CM | POA: Insufficient documentation

## 2016-05-18 LAB — CBC
HCT: 41 % (ref 35.0–47.0)
Hemoglobin: 13.3 g/dL (ref 12.0–16.0)
MCH: 24.5 pg — ABNORMAL LOW (ref 26.0–34.0)
MCHC: 32.5 g/dL (ref 32.0–36.0)
MCV: 75.5 fL — ABNORMAL LOW (ref 80.0–100.0)
Platelets: 230 10*3/uL (ref 150–440)
RBC: 5.43 MIL/uL — AB (ref 3.80–5.20)
RDW: 16.3 % — ABNORMAL HIGH (ref 11.5–14.5)
WBC: 5.2 10*3/uL (ref 3.6–11.0)

## 2016-05-18 LAB — BASIC METABOLIC PANEL WITH GFR
Anion gap: 7 (ref 5–15)
BUN: 10 mg/dL (ref 6–20)
CO2: 22 mmol/L (ref 22–32)
Calcium: 8.4 mg/dL — ABNORMAL LOW (ref 8.9–10.3)
Chloride: 107 mmol/L (ref 101–111)
Creatinine, Ser: 0.94 mg/dL (ref 0.44–1.00)
GFR calc Af Amer: >60 mL/min
GFR calc non Af Amer: >60 mL/min
Glucose, Bld: 224 mg/dL — ABNORMAL HIGH (ref 65–99)
Potassium: 3.2 mmol/L — ABNORMAL LOW (ref 3.5–5.1)
Sodium: 136 mmol/L (ref 135–145)

## 2016-05-18 LAB — URINALYSIS, COMPLETE (UACMP) WITH MICROSCOPIC
BACTERIA UA: NONE SEEN
Bilirubin Urine: NEGATIVE
GLUCOSE, UA: NEGATIVE mg/dL
Hgb urine dipstick: NEGATIVE
KETONES UR: 5 mg/dL — AB
Leukocytes, UA: NEGATIVE
NITRITE: NEGATIVE
PROTEIN: 30 mg/dL — AB
Specific Gravity, Urine: 1.02 (ref 1.005–1.030)
pH: 5 (ref 5.0–8.0)

## 2016-05-18 LAB — GLUCOSE, CAPILLARY: GLUCOSE-CAPILLARY: 202 mg/dL — AB (ref 65–99)

## 2016-05-18 NOTE — ED Triage Notes (Signed)
Pt reports syncopal episode at work today after donating plasma. Pt reports becoming dizzy and sweaty prior to passing out. Pt alert and oriented in triage.

## 2016-05-18 NOTE — ED Provider Notes (Signed)
[facility] Emergency Department Provider Note   ____________________________________________    I have reviewed the triage vital signs and the nursing notes.   HISTORY  Chief Complaint Loss of Consciousness     HPI Norma Bryant is a 61 y.o. female who presents after a syncopal episode. Patient reports that she donated plasma today and later when she was at work she felt lightheaded and dizzy and sat down and may have briefly blacked out. She denies chest pain, no shortness of breath. No recent travel. No calf pain. No history of arrhythmia. She reports she is given plasma before without difficulty but does note that she didn't eat breakfast this morning and was up late last night. No fevers or chills. Currently feels quite well and has no complaints at all.   Past Medical History:  Diagnosis Date  . Anemia   . Colon polyp   . Depression   . History of chicken pox   . History of measles   . History of mumps   . Panic attack   . Vertigo    no episodes over 1 yr    Patient Active Problem List   Diagnosis Date Noted  . Arthritis 04/21/2015  . Acid indigestion 04/21/2015  . Esophageal reflux 04/21/2015  . Low back pain 02/18/2015  . Abnormal weight gain 02/18/2015  . Anemia 02/17/2015  . History of adenomatous polyp of colon 02/17/2015  . Cystocele 02/17/2015  . Depression 02/17/2015  . Fibroids 02/17/2015  . Panic attacks 02/17/2015  . Tendinitis of elbow or forearm 02/17/2015    Past Surgical History:  Procedure Laterality Date  . colon polyp removal  2010   colonoscopy  . COLONOSCOPY    . COLONOSCOPY WITH PROPOFOL N/A 03/01/2016   Procedure: COLONOSCOPY WITH PROPOFOL;  Surgeon: Jonathon Bellows, MD;  Location[facility] ENDOSCOPY;  Service: Endoscopy;  Laterality: N/A;  . Mammogram Screening  03/2011   [facility]  . pap smear  03/17/2012   [facility]    Prior to Admission medications   Medication Sig Start Date End Date Taking? Authorizing Provider    gabapentin (NEURONTIN) 300 MG capsule Start with 1 tab po qhs X 1 week, then increase to 1 tab po bid X 1 week then 1 tab po prn 11/16/15   Gerda Diss, DO  Multiple Vitamins-Minerals (CENTRUM ADULTS PO) Take by mouth daily.    Historical Provider, MD  sertraline (ZOLOFT) 50 MG tablet TAKE 1 TABLET BY MOUTH DAILY 02/13/16   Birdie Sons, MD  traMADol (ULTRAM) 50 MG tablet Take 1 tablet (50 mg total) by mouth every 6 (six) hours as needed. 11/16/15   Gerda Diss, DO     Allergies Patient has no known allergies.  Family History  Problem Relation Age of Onset  . Congestive Heart Failure Mother   . Heart attack Mother     1st MI in her 58's  . Cirrhosis Father     of liver  . Alcohol abuse Father   . Diabetes Maternal Aunt     Social History Social History  Substance Use Topics  . Smoking status: Never Smoker  . Smokeless tobacco: Never Used  . Alcohol use No    Review of Systems  Constitutional: No fever/chills Eyes: No visual changes.  ENT: No sore throat. Cardiovascular: Denies chest pain.No palpitations Respiratory: Denies shortness of breath. Gastrointestinal: No abdominal pain.  No nausea, no vomiting.   Genitourinary: Negative for dysuria. Musculoskeletal: Negative for back  pain. Skin: Negative for rash. Neurological: Negative for headaches    ____________________________________________   PHYSICAL EXAM:  VITAL SIGNS: ED Triage Vitals  Enc Vitals Group     BP 05/18/16 1554 132/87     Pulse Rate 05/18/16 1554 83     Resp 05/18/16 1554 18     Temp 05/18/16 1554 97.8 F (36.6 C)     Temp Source 05/18/16 1554 Oral     SpO2 05/18/16 1554 98 %     Weight 05/18/16 1554 216 lb (98 kg)     Height 05/18/16 1554 5\' 4"  (1.626 m)     Head Circumference --      Peak Flow --      Pain Score 05/18/16 1744 0     Pain Loc --      Pain Edu? --      Excl. in [place]? --     Constitutional: Alert and oriented. No acute distress. Pleasant and interactive Eyes:  Conjunctivae are normal.  Head: Atraumatic. Nose: No congestion/rhinnorhea. Mouth/Throat: Mucous membranes are moist.    Cardiovascular: Normal rate, regular rhythm. Grossly normal heart sounds.  Good peripheral circulation. Respiratory: Normal respiratory effort.  No retractions. Lungs CTAB. Gastrointestinal: Soft and nontender. No distention.  No CVA tenderness. Genitourinary: deferred Musculoskeletal: No lower extremity tenderness nor edema.  Warm and well perfused Neurologic:  Normal speech and language. No gross focal neurologic deficits are appreciated.  Skin:  Skin is warm, dry and intact. No rash noted. Psychiatric: Mood and affect are normal. Speech and behavior are normal.  ____________________________________________   LABS (all labs ordered are listed, but only abnormal results are displayed)  Labs Reviewed  BASIC METABOLIC PANEL - Abnormal; Notable for the following:       Result Value   Potassium 3.2 (*)    Glucose, Bld 224 (*)    Calcium 8.4 (*)    All other components within normal limits  CBC - Abnormal; Notable for the following:    RBC 5.43 (*)    MCV 75.5 (*)    MCH 24.5 (*)    RDW 16.3 (*)    All other components within normal limits  URINALYSIS, COMPLETE (UACMP) WITH MICROSCOPIC - Abnormal; Notable for the following:    Color, Urine AMBER (*)    APPearance CLOUDY (*)    Ketones, ur 5 (*)    Protein, ur 30 (*)    Squamous Epithelial / LPF 6-30 (*)    All other components within normal limits  GLUCOSE, CAPILLARY - Abnormal; Notable for the following:    Glucose-Capillary 202 (*)    All other components within normal limits  CBG MONITORING, ED   ____________________________________________  EKG  ED ECG REPORT I, Lavonia Drafts, the attending physician, personally viewed and interpreted this ECG.  Date: 05/18/2016  Rhythm: normal sinus rhythm QRS Axis: normal Intervals: normal ST/T Wave abnormalities: normal Conduction Disturbances:  none Narrative Interpretation: unremarkable  ____________________________________________  RADIOLOGY  None ____________________________________________   PROCEDURES  Procedure(s) performed: No    Critical Care performed:No ____________________________________________   INITIAL IMPRESSION / ASSESSMENT AND PLAN / ED COURSE  Pertinent labs & imaging results that were available during my care of the patient were reviewed by me and considered in my medical decision making (see chart for details).  Patient well-appearing and in no acute distress. Lab work is reassuring. EKG is normal. Exam is benign. Suspect syncopal episode related to plasma donation, encouraged rest, by mouth intake. Do not feel further ED workup  is necessary at this time. Follow-up with PCP. Return precautions discussed. Patient is quite comfortable with this plan    ____________________________________________   FINAL CLINICAL IMPRESSION(S) / ED DIAGNOSES  Final diagnoses:  Syncope, unspecified syncope type      NEW MEDICATIONS STARTED DURING THIS VISIT:  Discharge Medication List as of 05/18/2016  5:22 PM       Note:  This document was prepared using Dragon voice recognition software and may include unintentional dictation errors.    Lavonia Drafts, MD 05/18/16 [phone]

## 2016-07-16 LAB — HM MAMMOGRAPHY

## 2016-07-17 ENCOUNTER — Encounter: Payer: Self-pay | Admitting: *Deleted

## 2016-07-23 ENCOUNTER — Encounter: Payer: Self-pay | Admitting: Family Medicine

## 2016-09-10 ENCOUNTER — Other Ambulatory Visit: Payer: Self-pay

## 2016-09-10 ENCOUNTER — Telehealth: Payer: Self-pay | Admitting: Gastroenterology

## 2016-09-10 DIAGNOSIS — Z86010 Personal history of colonic polyps: Secondary | ICD-10-CM

## 2016-09-10 NOTE — Telephone Encounter (Signed)
Patient is calling to schedule her colonoscopy.

## 2016-09-10 NOTE — Telephone Encounter (Signed)
Gastroenterology Pre-Procedure Review  Request Date: 09/06 Requesting Physician: Dr. Allen Norris  PATIENT REVIEW QUESTIONS: The patient responded to the following health history questions as indicated:    1. Are you having any GI issues? no 2. Do you have a personal history of Polyps? yes (polyps) 3. Do you have a family history of Colon Cancer or Polyps? yes (family history polyps) 4. Diabetes Mellitus? no 5. Joint replacements in the past 12 months?no 6. Major health problems in the past 3 months?no 7. Any artificial heart valves, MVP, or defibrillator?no    MEDICATIONS & ALLERGIES:    Patient reports the following regarding taking any anticoagulation/antiplatelet therapy:   Plavix, Coumadin, Eliquis, Xarelto, Lovenox, Pradaxa, Brilinta, or Effient? no Aspirin? no  Patient confirms/reports the following medications:  Current Outpatient Prescriptions  Medication Sig Dispense Refill  . gabapentin (NEURONTIN) 300 MG capsule Start with 1 tab po qhs X 1 week, then increase to 1 tab po bid X 1 week then 1 tab po prn 90 capsule 1  . Multiple Vitamins-Minerals (CENTRUM ADULTS PO) Take by mouth daily.    . sertraline (ZOLOFT) 50 MG tablet TAKE 1 TABLET BY MOUTH DAILY 90 tablet 1  . traMADol (ULTRAM) 50 MG tablet Take 1 tablet (50 mg total) by mouth every 6 (six) hours as needed. 30 tablet 0   No current facility-administered medications for this visit.     Patient confirms/reports the following allergies:  No Known Allergies  No orders of the defined types were placed in this encounter.   AUTHORIZATION INFORMATION Primary Insurance: 1D#: Group #:  Secondary Insurance: 1D#: Group #:  SCHEDULE INFORMATION: Date: 09/20/16 Time: Location:[facility]

## 2016-09-12 ENCOUNTER — Encounter: Payer: Self-pay | Admitting: *Deleted

## 2016-09-18 ENCOUNTER — Telehealth: Payer: Self-pay | Admitting: Gastroenterology

## 2016-09-18 ENCOUNTER — Other Ambulatory Visit: Payer: Self-pay

## 2016-09-18 DIAGNOSIS — Z1211 Encounter for screening for malignant neoplasm of colon: Secondary | ICD-10-CM

## 2016-09-18 MED ORDER — NA SULFATE-K SULFATE-MG SULF 17.5-3.13-1.6 GM/177ML PO SOLN: 1.0000 | Freq: Once | ORAL | 0 refills | Status: AC

## 2016-09-18 NOTE — Telephone Encounter (Signed)
Please call patient as she never received her prepping info in the mail.

## 2016-09-18 NOTE — Telephone Encounter (Signed)
Patient did not receive her instructions for colonoscopy.  rx called to pharmacy. Gave pt instructions verbally and sent them via my chart to her as well.  Thanks [employer]

## 2016-09-19 NOTE — Discharge Instructions (Signed)
General Anesthesia, Adult, Care After °These instructions provide you with information about caring for yourself after your procedure. Your health care provider may also give you more specific instructions. Your treatment has been planned according to current medical practices, but problems sometimes occur. Call your health care provider if you have any problems or questions after your procedure. °What can I expect after the procedure? °After the procedure, it is common to have: °· Vomiting. °· A sore throat. °· Mental slowness. ° °It is common to feel: °· Nauseous. °· Cold or shivery. °· Sleepy. °· Tired. °· Sore or achy, even in parts of your body where you did not have surgery. ° °Follow these instructions at home: °For at least 24 hours after the procedure: °· Do not: °? Participate in activities where you could fall or become injured. °? Drive. °? Use heavy machinery. °? Drink alcohol. °? Take sleeping pills or medicines that cause drowsiness. °? Make important decisions or sign legal documents. °? Take care of children on your own. °· Rest. °Eating and drinking °· If you vomit, drink water, juice, or soup when you can drink without vomiting. °· Drink enough fluid to keep your urine clear or pale yellow. °· Make sure you have little or no nausea before eating solid foods. °· Follow the diet recommended by your health care provider. °General instructions °· Have a responsible adult stay with you until you are awake and alert. °· Return to your normal activities as told by your health care provider. Ask your health care provider what activities are safe for you. °· Take over-the-counter and prescription medicines only as told by your health care provider. °· If you smoke, do not smoke without supervision. °· Keep all follow-up visits as told by your health care provider. This is important. °Contact a health care provider if: °· You continue to have nausea or vomiting at home, and medicines are not helpful. °· You  cannot drink fluids or start eating again. °· You cannot urinate after 8-12 hours. °· You develop a skin rash. °· You have fever. °· You have increasing redness at the site of your procedure. °Get help right away if: °· You have difficulty breathing. °· You have chest pain. °· You have unexpected bleeding. °· You feel that you are having a life-threatening or urgent problem. °This information is not intended to replace advice given to you by your health care provider. Make sure you discuss any questions you have with your health care provider. °Document Released: 04/09/2000 Document Revised: 06/06/2015 Document Reviewed: 12/16/2014 °Elsevier Interactive Patient Education © 2018 [employer] Inc[employer] ° °

## 2016-09-20 ENCOUNTER — Ambulatory Visit: Payer: BLUE CROSS/BLUE SHIELD | Admitting: Anesthesiology

## 2016-09-20 ENCOUNTER — Encounter
Admission: RE | Disposition: A | Discharge status: Home / Self Care | Payer: Self-pay | Source: Ambulatory Visit | Attending: Gastroenterology

## 2016-09-20 ENCOUNTER — Ambulatory Visit
Admission: RE | Admit: 2016-09-20 | Discharge: 2016-09-20 | Disposition: A | Discharge status: Home / Self Care | Payer: BLUE CROSS/BLUE SHIELD | Source: Ambulatory Visit | Attending: Gastroenterology | Admitting: Gastroenterology

## 2016-09-20 DIAGNOSIS — Z1211 Encounter for screening for malignant neoplasm of colon: Secondary | ICD-10-CM | POA: Diagnosis present

## 2016-09-20 DIAGNOSIS — M1990 Unspecified osteoarthritis, unspecified site: Secondary | ICD-10-CM | POA: Diagnosis not present

## 2016-09-20 DIAGNOSIS — D122 Benign neoplasm of ascending colon: Secondary | ICD-10-CM | POA: Diagnosis not present

## 2016-09-20 DIAGNOSIS — Z122 Encounter for screening for malignant neoplasm of respiratory organs: Secondary | ICD-10-CM | POA: Diagnosis not present

## 2016-09-20 DIAGNOSIS — Z79899 Other long term (current) drug therapy: Secondary | ICD-10-CM | POA: Diagnosis not present

## 2016-09-20 DIAGNOSIS — F329 Major depressive disorder, single episode, unspecified: Secondary | ICD-10-CM | POA: Insufficient documentation

## 2016-09-20 DIAGNOSIS — F410 Panic disorder [episodic paroxysmal anxiety] without agoraphobia: Secondary | ICD-10-CM | POA: Insufficient documentation

## 2016-09-20 DIAGNOSIS — Z8619 Personal history of other infectious and parasitic diseases: Secondary | ICD-10-CM | POA: Insufficient documentation

## 2016-09-20 DIAGNOSIS — K219 Gastro-esophageal reflux disease without esophagitis: Secondary | ICD-10-CM | POA: Insufficient documentation

## 2016-09-20 DIAGNOSIS — Z86010 Personal history of colonic polyps: Secondary | ICD-10-CM | POA: Insufficient documentation

## 2016-09-20 DIAGNOSIS — D125 Benign neoplasm of sigmoid colon: Secondary | ICD-10-CM | POA: Diagnosis not present

## 2016-09-20 DIAGNOSIS — E669 Obesity, unspecified: Secondary | ICD-10-CM | POA: Diagnosis not present

## 2016-09-20 DIAGNOSIS — K635 Polyp of colon: Secondary | ICD-10-CM | POA: Diagnosis not present

## 2016-09-20 DIAGNOSIS — Z6835 Body mass index (BMI) 35.0-35.9, adult: Secondary | ICD-10-CM | POA: Insufficient documentation

## 2016-09-20 HISTORY — PX: POLYPECTOMY: SHX5525

## 2016-09-20 HISTORY — PX: COLONOSCOPY WITH PROPOFOL: SHX5780

## 2016-09-20 SURGERY — COLONOSCOPY WITH PROPOFOL
Anesthesia: General | Site: Rectum | Wound class: Dirty or Infected

## 2016-09-20 MED ORDER — SODIUM CHLORIDE 0.9 % IV SOLN: INTRAVENOUS | Status: DC

## 2016-09-20 MED ORDER — STERILE WATER FOR IRRIGATION IR SOLN
Status: DC | PRN
  Administered 2016-09-20: 10:00:00

## 2016-09-20 MED ORDER — OXYCODONE HCL 5 MG/5ML PO SOLN: 5.0000 mg | Freq: Once | ORAL | Status: DC | PRN

## 2016-09-20 MED ORDER — FENTANYL CITRATE (PF) 100 MCG/2ML IJ SOLN: 25.0000 ug | INTRAMUSCULAR | Status: DC | PRN

## 2016-09-20 MED ORDER — LACTATED RINGERS IV SOLN
10.0000 mL/h | INTRAVENOUS | Status: DC
  Administered 2016-09-20: 10 mL/h via INTRAVENOUS

## 2016-09-20 MED ORDER — PROPOFOL 10 MG/ML IV BOLUS
INTRAVENOUS | Status: DC | PRN
Start: 2016-09-20 — End: 2016-09-20
  Administered 2016-09-20: 10 mg via INTRAVENOUS
  Administered 2016-09-20: 70 mg via INTRAVENOUS
  Administered 2016-09-20 (×3): 20 mg via INTRAVENOUS

## 2016-09-20 MED ORDER — LIDOCAINE HCL (CARDIAC) 20 MG/ML IV SOLN
INTRAVENOUS | Status: DC | PRN
  Administered 2016-09-20: 50 mg via INTRAVENOUS

## 2016-09-20 MED ORDER — PROMETHAZINE HCL 25 MG/ML IJ SOLN: 6.2500 mg | INTRAMUSCULAR | Status: DC | PRN

## 2016-09-20 MED ORDER — OXYCODONE HCL 5 MG PO TABS: 5.0000 mg | ORAL_TABLET | Freq: Once | ORAL | Status: DC | PRN

## 2016-09-20 MED ORDER — MEPERIDINE HCL 25 MG/ML IJ SOLN: 6.2500 mg | INTRAMUSCULAR | Status: DC | PRN

## 2016-09-20 SURGICAL SUPPLY — 23 items

## 2016-09-20 NOTE — Transfer of Care (Signed)
Immediate Anesthesia Transfer of Care Note  Patient: Norma Bryant  Procedure(s) Performed: Procedure(s): COLONOSCOPY WITH PROPOFOL (N/A) POLYPECTOMY (N/A)  Patient Location: PACU  Anesthesia Type: General  Level of Consciousness: awake, alert  and patient cooperative  Airway and Oxygen Therapy: Patient Spontanous Breathing and Patient connected to supplemental oxygen  Post-op Assessment: Post-op Vital signs reviewed, Patient's Cardiovascular Status Stable, Respiratory Function Stable, Patent Airway and No signs of Nausea or vomiting  Post-op Vital Signs: Reviewed and stable  Complications: No apparent anesthesia complications

## 2016-09-20 NOTE — H&P (Signed)
Lucilla Lame, MD [address] [place], [place] [address] Phone:[phone] Fax : [phone]  Primary Care Physician:  Birdie Sons, MD Primary Gastroenterologist:  Dr. Allen Norris  Pre-Procedure History & Physical: HPI:  Norma Bryant is a 61 y.o. female is here for an colonoscopy.   Past Medical History:  Diagnosis Date  . Anemia   . Colon polyp   . Depression   . History of chicken pox   . History of measles   . History of mumps   . Panic attack   . Vertigo    no episodes over 1 yr    Past Surgical History:  Procedure Laterality Date  . colon polyp removal  2010   colonoscopy  . COLONOSCOPY    . COLONOSCOPY WITH PROPOFOL N/A 03/01/2016   Procedure: COLONOSCOPY WITH PROPOFOL;  Surgeon: Jonathon Bellows, MD;  Location: [facility] ENDOSCOPY;  Service: Endoscopy;  Laterality: N/A;  . Mammogram Screening  03/2011   [facility]  . pap smear  03/17/2012   [facility]    Prior to Admission medications   Medication Sig Start Date End Date Taking? Authorizing Provider  Multiple Vitamins-Minerals (CENTRUM ADULTS PO) Take by mouth daily.   Yes [provider]  gabapentin (NEURONTIN) 300 MG capsule Start with 1 tab po qhs X 1 week, then increase to 1 tab po bid X 1 week then 1 tab po prn Patient not taking: Reported on 09/12/2016 11/16/15   Gerda Diss, DO  sertraline (ZOLOFT) 50 MG tablet TAKE 1 TABLET BY MOUTH DAILY Patient not taking: Reported on 09/12/2016 02/13/16   Birdie Sons, MD  traMADol (ULTRAM) 50 MG tablet Take 1 tablet (50 mg total) by mouth every 6 (six) hours as needed. Patient not taking: Reported on 09/12/2016 11/16/15   Gerda Diss, DO    Allergies as of 09/10/2016  . (No Known Allergies)    Family History  Problem Relation Age of Onset  . Congestive Heart Failure Mother   . Heart attack Mother        1st MI in her 1's  . Cirrhosis Father        of liver  . Alcohol abuse Father   . Diabetes Maternal Aunt     Social History   Social  History  . Marital status: Divorced    Spouse name: N/A  . Number of children: 4  . Years of education: N/A   Occupational History  . Retail sales associate     Works for [employer]  . Smoking status: Never Smoker  . Smokeless tobacco: Never Used  . Alcohol use No  . Drug use: No  . Sexual activity: Not on file   Other Topics Concern  . Not on file   Social History Narrative  . No narrative on file    Review of Systems: See HPI, otherwise negative ROS  Physical Exam: BP (!) 132/98   Pulse 73   Temp (!) 97.5 F (36.4 C) (Temporal)   Resp 16   Ht 5\' 4"  (1.626 m)   Wt 205 lb (93 kg)   SpO2 100%   BMI 35.19 kg/m  General:   Alert,  pleasant and cooperative in NAD Head:  Normocephalic and atraumatic. Neck:  Supple; no masses or thyromegaly. Lungs:  Clear throughout to auscultation.    Heart:  Regular rate and rhythm. Abdomen:  Soft, nontender and nondistended. Normal bowel sounds, without guarding, and without rebound.  Neurologic:  Alert and  oriented x4;  grossly normal neurologically.  Impression/Plan: Norma Bryant is here for an colonoscopy to be performed for history of volon polyps  Risks, benefits, limitations, and alternatives regarding  colonoscopy have been reviewed with the patient.  Questions have been answered.  All parties agreeable.   Lucilla Lame, MD  09/20/2016, 8:51 AM

## 2016-09-20 NOTE — Anesthesia Postprocedure Evaluation (Signed)
Anesthesia Post Note  Patient: Norma Bryant  Procedure(s) Performed: Procedure(s) (LRB): COLONOSCOPY WITH PROPOFOL (N/A) POLYPECTOMY (N/A)  Patient location during evaluation: PACU Anesthesia Type: General Level of consciousness: awake and alert Pain management: pain level controlled Vital Signs Assessment: post-procedure vital signs reviewed and stable Respiratory status: spontaneous breathing, nonlabored ventilation, respiratory function stable and patient connected to nasal cannula oxygen Cardiovascular status: blood pressure returned to baseline and stable Postop Assessment: no signs of nausea or vomiting Anesthetic complications: no    Deavion Strider ELAINE

## 2016-09-20 NOTE — Anesthesia Procedure Notes (Signed)
Performed by: Capucine Tryon Pre-anesthesia Checklist: Patient identified, Emergency Drugs available, Suction available, Timeout performed and Patient being monitored Patient Re-evaluated:Patient Re-evaluated prior to induction Oxygen Delivery Method: Nasal cannula Placement Confirmation: positive ETCO2       

## 2016-09-20 NOTE — Anesthesia Preprocedure Evaluation (Signed)
Anesthesia Evaluation  Patient identified by MRN, date of birth, ID band Patient awake    Reviewed: Allergy & Precautions, NPO status , Patient's Chart, lab work & pertinent test results  History of Anesthesia Complications Negative for: history of anesthetic complications  Airway Mallampati: II  TM Distance: >3 FB Neck ROM: Full    Dental no notable dental hx.    Pulmonary neg pulmonary ROS, neg sleep apnea, neg COPD,    breath sounds clear to auscultation- rhonchi (-) wheezing      Cardiovascular Exercise Tolerance: Good (-) hypertension(-) CAD and (-) Past MI  Rhythm:Regular Rate:Normal - Systolic murmurs and - Diastolic murmurs    Neuro/Psych PSYCHIATRIC DISORDERS Anxiety Depression negative neurological ROS     GI/Hepatic Neg liver ROS, GERD  ,  Endo/Other  negative endocrine ROSneg diabetes  Renal/GU negative Renal ROS     Musculoskeletal  (+) Arthritis ,   Abdominal (+) + obese,   Peds  Hematology  (+) anemia ,   Anesthesia Other Findings Past Medical History: No date: Anemia No date: Colon polyp No date: Depression No date: History of chicken pox No date: History of measles No date: History of mumps No date: Panic attack No date: Vertigo     Comment: no episodes over 1 yr   Reproductive/Obstetrics                             Anesthesia Physical  Anesthesia Plan  ASA: II  Anesthesia Plan: General   Post-op Pain Management:    Induction: Intravenous  PONV Risk Score and Plan:   Airway Management Planned: Natural Airway  Additional Equipment:   Intra-op Plan:   Post-operative Plan:   Informed Consent: I have reviewed the patients History and Physical, chart, labs and discussed the procedure including the risks, benefits and alternatives for the proposed anesthesia with the patient or authorized representative who has indicated his/her understanding and  acceptance.   Dental advisory given  Plan Discussed with: CRNA and Anesthesiologist  Anesthesia Plan Comments:         Anesthesia Quick Evaluation

## 2016-09-20 NOTE — Op Note (Signed)
[facility] Gastroenterology Patient Name: Norma Bryant Procedure Date: 09/20/2016 9:29 AM MRN: [id number] Account #: [id number] Date of Birth: May 07, 1955 Admit Type: Outpatient Age: 61 Room: [facility] OR ROOM 01 Gender: Female Note Status: Finalized Procedure:            Colonoscopy Indications:          High risk colon cancer surveillance: Personal history                        of colonic polyps Providers:            Lucilla Lame MD, MD Referring MD:         Kirstie Peri. Caryn Section, MD (Referring MD) Medicines:            Propofol per Anesthesia Complications:        No immediate complications. Procedure:            Pre-Anesthesia Assessment:                       - Prior to the procedure, a History and Physical was                        performed, and patient medications and allergies were                        reviewed. The patient's tolerance of previous                        anesthesia was also reviewed. The risks and benefits of                        the procedure and the sedation options and risks were                        discussed with the patient. All questions were                        answered, and informed consent was obtained. Prior                        Anticoagulants: The patient has taken no previous                        anticoagulant or antiplatelet agents. ASA Grade                        Assessment: II - A patient with mild systemic disease.                        After reviewing the risks and benefits, the patient was                        deemed in satisfactory condition to undergo the                        procedure.                       After obtaining informed consent, the colonoscope was  passed under direct vision. Throughout the procedure,                        the patient's blood pressure, pulse, and oxygen                        saturations were monitored continuously. The [place] [id number]) was introduced through the                        anus and advanced to the the cecum, identified by                        appendiceal orifice and ileocecal valve. The                        colonoscopy was performed without difficulty. The                        patient tolerated the procedure well. The quality of                        the bowel preparation was excellent. Findings:      The perianal and digital rectal examinations were normal.      Two sessile polyps were found in the ascending colon. The polyps were 2       to 3 mm in size. These polyps were removed with a cold biopsy forceps.       Resection and retrieval were complete.      A 3 mm polyp was found in the sigmoid colon. The polyp was sessile. The       polyp was removed with a cold biopsy forceps. Resection and retrieval       were complete. Impression:           - Two 2 to 3 mm polyps in the ascending colon, removed                        with a cold biopsy forceps. Resected and retrieved.                       - One 3 mm polyp in the sigmoid colon, removed with a                        cold biopsy forceps. Resected and retrieved. Recommendation:       - Discharge patient to home.                       - Resume previous diet.                       - Continue present medications.                       - Await pathology results.                       - Repeat colonoscopy in 3 years for surveillance. Procedure Code(s):    --- Professional ---  45380, Colonoscopy, flexible; with biopsy, single or                        multiple Diagnosis Code(s):    --- Professional ---                       Z86.010, Personal history of colonic polyps                       D12.2, Benign neoplasm of ascending colon                       D12.5, Benign neoplasm of sigmoid colon CPT copyright 2016 American Medical Association. All rights reserved. The codes documented in this report are  preliminary and upon coder review may  be revised to meet current compliance requirements. Lucilla Lame MD, MD 09/20/2016 9:59:55 AM This report has been signed electronically. Number of Addenda: 0 Note Initiated On: 09/20/2016 9:29 AM Scope Withdrawal Time: 0 hours 8 minutes 55 seconds  Total Procedure Duration: 0 hours 11 minutes 16 seconds       [facility]

## 2016-09-21 ENCOUNTER — Encounter: Payer: Self-pay | Admitting: Gastroenterology

## 2016-09-24 ENCOUNTER — Encounter: Payer: Self-pay | Admitting: Gastroenterology

## 2017-11-28 ENCOUNTER — Other Ambulatory Visit: Payer: Self-pay

## 2017-11-28 ENCOUNTER — Telehealth: Payer: Self-pay

## 2017-11-28 ENCOUNTER — Ambulatory Visit
Admission: EM | Admit: 2017-11-28 | Discharge: 2017-11-28 | Disposition: A | Discharge status: Home / Self Care | Payer: Worker's Compensation | Attending: Family Medicine | Admitting: Family Medicine

## 2017-11-28 ENCOUNTER — Ambulatory Visit: Payer: Worker's Compensation

## 2017-11-28 ENCOUNTER — Ambulatory Visit (INDEPENDENT_AMBULATORY_CARE_PROVIDER_SITE_OTHER): Payer: Worker's Compensation

## 2017-11-28 DIAGNOSIS — X58XXXA Exposure to other specified factors, initial encounter: Secondary | ICD-10-CM

## 2017-11-28 DIAGNOSIS — S92911A Unspecified fracture of right toe(s), initial encounter for closed fracture: Secondary | ICD-10-CM

## 2017-11-28 DIAGNOSIS — S92511A Displaced fracture of proximal phalanx of right lesser toe(s), initial encounter for closed fracture: Secondary | ICD-10-CM

## 2017-11-28 DIAGNOSIS — W208XXA Other cause of strike by thrown, projected or falling object, initial encounter: Secondary | ICD-10-CM

## 2017-11-28 MED ORDER — MELOXICAM 15 MG PO TABS: 15.0000 mg | ORAL_TABLET | Freq: Every day | ORAL | 0 refills | Status: DC | PRN

## 2017-11-28 NOTE — ED Triage Notes (Signed)
Patient complains of toe pain that occurred when a metal picture frame dropped on to her right toes and she has been noticing swelling. States that this occurred while at work on 11/17/2017.

## 2017-11-28 NOTE — Discharge Instructions (Signed)
Post op shoe.  Rest.  Medication as prescribed.  Take care  Dr. Lacinda Axon

## 2017-11-28 NOTE — ED Provider Notes (Signed)
[facility]    CSN: [id number] Arrival date & time: 11/28/17  1158  History   Chief Complaint Chief Complaint  Patient presents with  . Work Related Injury  . Toe Pain   HPI  62 year old female presents with the above complaint.  Patient reports that she can feel her right foot/toe last week.  Injury occurred on Monday, 11/4.  Patient reports that she dropped a metal picture frame on her foot.  Patient reports pain particularly of the third toe.  Patient states that she has been soaking the area without improvement.  No medications or other interventions tried.  Continues to have moderate pain.  Worse with activity.  No relieving factors.  No other complaints.  PMH, Surgical Hx, Family Hx, Social History reviewed and updated as below.  Past Medical History:  Diagnosis Date  . Anemia   . Colon polyp   . Depression   . History of chicken pox   . History of measles   . History of mumps   . Panic attack   . Vertigo    no episodes over 1 yr    Patient Active Problem List   Diagnosis Date Noted  . Screening for malignant neoplasm of respiratory organ   . Polyp of sigmoid colon   . Arthritis 04/21/2015  . Acid indigestion 04/21/2015  . Esophageal reflux 04/21/2015  . Low back pain 02/18/2015  . Abnormal weight gain 02/18/2015  . Anemia 02/17/2015  . History of colonic polyps 02/17/2015  . Cystocele 02/17/2015  . Depression 02/17/2015  . Fibroids 02/17/2015  . Panic attacks 02/17/2015  . Tendinitis of elbow or forearm 02/17/2015    Past Surgical History:  Procedure Laterality Date  . colon polyp removal  2010   colonoscopy  . COLONOSCOPY    . COLONOSCOPY WITH PROPOFOL N/A 03/01/2016   Procedure: COLONOSCOPY WITH PROPOFOL;  Surgeon: Jonathon Bellows, MD;  Location: [facility] ENDOSCOPY;  Service: Endoscopy;  Laterality: N/A;  . COLONOSCOPY WITH PROPOFOL N/A 09/20/2016   Procedure: COLONOSCOPY WITH PROPOFOL;  Surgeon: Lucilla Lame, MD;  Location: [place];   Service: Gastroenterology;  Laterality: N/A;  . Mammogram Screening  03/2011   [facility]  . pap smear  03/17/2012   [facility]  . POLYPECTOMY N/A 09/20/2016   Procedure: POLYPECTOMY;  Surgeon: Lucilla Lame, MD;  Location: [place];  Service: Gastroenterology;  Laterality: N/A;    OB History    Gravida  6   Para  4   Term      Preterm      AB      Living        SAB      TAB      Ectopic      Multiple      Live Births               Home Medications    Prior to Admission medications   Medication Sig Start Date End Date Taking? Authorizing Provider  meloxicam (MOBIC) 15 MG tablet Take 1 tablet (15 mg total) by mouth daily as needed. 11/28/17   Coral Spikes, DO    Family History Family History  Problem Relation Age of Onset  . Congestive Heart Failure Mother   . Heart attack Mother        1st MI in her 28's  . Cirrhosis Father        of liver  . Alcohol abuse Father   . Diabetes Maternal Aunt  Social History Social History   Tobacco Use  . Smoking status: Never Smoker  . Smokeless tobacco: Never Used  Substance Use Topics  . Alcohol use: No    Alcohol/week: 0.0 standard drinks  . Drug use: No     Allergies   Patient has no known allergies.   Review of Systems Review of Systems  Constitutional: Negative.   Musculoskeletal:       Foot/toe pain.   Physical Exam Triage Vital Signs ED Triage Vitals  Enc Vitals Group     BP 11/28/17 1250 (!) 142/88     Pulse Rate 11/28/17 1250 69     Resp 11/28/17 1250 18     Temp 11/28/17 1250 97.7 F (36.5 C)     Temp Source 11/28/17 1250 Oral     SpO2 11/28/17 1250 100 %     Weight 11/28/17 1248 211 lb (95.7 kg)     Height 11/28/17 1248 5\' 4"  (1.626 m)     Head Circumference --      Peak Flow --      Pain Score 11/28/17 1248 10     Pain Loc --      Pain Edu? --      Excl. in [place]? --    Updated Vital Signs BP (!) 142/88 (BP Location: Left Arm)   Pulse 69   Temp 97.7 F (36.5 C) (Oral)    Resp 18   Ht 5\' 4"  (1.626 m)   Wt 95.7 kg   SpO2 100%   BMI 36.22 kg/m   Visual Acuity Right Eye Distance:   Left Eye Distance:   Bilateral Distance:    Right Eye Near:   Left Eye Near:    Bilateral Near:     Physical Exam  Constitutional: She is oriented to person, place, and time. She appears well-developed. No distress.  HENT:  Head: Normocephalic and atraumatic.  Pulmonary/Chest: Effort normal. No respiratory distress.  Musculoskeletal:  Right foot -patient with tenderness to palpation of the proximal third toe.  Neurological: She is alert and oriented to person, place, and time.  Psychiatric: She has a normal mood and affect. Her behavior is normal.  Nursing note and vitals reviewed.  UC Treatments / Results  Labs (all labs ordered are listed, but only abnormal results are displayed) Labs Reviewed - No data to display  EKG None  Radiology Dg Foot Complete Right  Result Date: 11/28/2017 CLINICAL DATA:  Pt states she dropped picture frame on top of 2nd and 3rd toe at MCP area today. Hx of shaved bone pinky toe years ago EXAM: RIGHT FOOT COMPLETE - 3+ VIEW COMPARISON:  07/25/2012 FINDINGS: There is an acute fracture of the third proximal phalanx, associated with minimal displacement. There is soft tissue swelling along the anterior aspect of the foot. Mild degenerative change at the first metatarsophalangeal joint. IMPRESSION: Minimally displaced fracture of the third proximal phalanx. Electronically Signed   By: Nolon Nations M.D.   On: 11/28/2017 13:26    Procedures Procedures (including critical care time)  Medications Ordered in UC Medications - No data to display  Initial Impression / Assessment and Plan / UC Course  I have reviewed the triage vital signs and the nursing notes.  Pertinent labs & imaging results that were available during my care of the patient were reviewed by me and considered in my medical decision making (see chart for details).      62 year old female presents with a minimally displaced fracture of the  third proximal phalanx.  Placing a postop shoe.  Meloxicam as needed.  Workmen's Comp. form filled out.  May return to work.  Final Clinical Impressions(s) / UC Diagnoses   Final diagnoses:  Closed non-physeal fracture of phalanx of toe of right foot, unspecified toe, initial encounter     Discharge Instructions     Post op shoe.  Rest.  Medication as prescribed.  Take care  Dr. Lacinda Axon    ED Prescriptions    Medication Sig Dispense Auth. Provider   meloxicam (MOBIC) 15 MG tablet Take 1 tablet (15 mg total) by mouth daily as needed. 30 tablet Coral Spikes, DO     Controlled Substance Prescriptions [place] Controlled Substance Registry consulted? Not Applicable   Coral Spikes, DO 11/28/17 1507

## 2017-12-30 ENCOUNTER — Other Ambulatory Visit: Payer: Self-pay

## 2017-12-30 ENCOUNTER — Ambulatory Visit
Admission: EM | Admit: 2017-12-30 | Discharge: 2017-12-30 | Disposition: A | Discharge status: Home / Self Care | Payer: Worker's Compensation | Attending: Family Medicine | Admitting: Family Medicine

## 2017-12-30 DIAGNOSIS — X58XXXA Exposure to other specified factors, initial encounter: Secondary | ICD-10-CM

## 2017-12-30 DIAGNOSIS — S92501D Displaced unspecified fracture of right lesser toe(s), subsequent encounter for fracture with routine healing: Secondary | ICD-10-CM | POA: Insufficient documentation

## 2017-12-30 NOTE — ED Provider Notes (Signed)
[facility] ____________________________________________  Time seen: Approximately 2:33 PM  I have reviewed the triage vital signs and the nursing notes.   HISTORY  Chief Complaint Toe Pain (right 3rd toe)   HPI Norma Bryant is a 62 y.o. female presenting for reevaluation of right third toe pain that has continued since last urgent care visit.  Original injury was from dropping a picture frame on toe.  Patient reports she had an original injury to right third toe on 11/17/2017, and was seen in urgent care 1 week later as the pain had continued.  It was noted then by x-ray that she had a minimally displaced third proximal phalanx toe fracture and was treated with postoperative shoe and Mobic.  States that she took the Mobic only for a few days and then stopped as she does not like to take medications on a regular basis, and states that she also took herself out of the shoe as it was uncomfortable to work in.  States that she wears heels at work as this is her standard shoe.  States that she has occasionally bumped her toe, but denies any other real reinjury.  States pain is currently somewhat better than previously, but here today as pain has continued.  Pain is worse after working throughout the day as she works greater than 12-hour shifts.  Still continues with slight swelling and bruising.  No pain radiation, paresthesias or other injuries.  Not taken any over-the-counter medications for the same complaint at this time and not currently taking Mobic.  Recent bronchitis.  Otherwise doing well denies other complaints.  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  Has not yet followed up with [employer].    Past Medical History:  Diagnosis Date  . Anemia   . Colon polyp   . Depression   . History of chicken pox   . History of measles   . History of mumps   . Panic attack   . Vertigo    no episodes over 1 yr    Patient Active Problem List   Diagnosis Date Noted  .  Screening for malignant neoplasm of respiratory organ   . Polyp of sigmoid colon   . Arthritis 04/21/2015  . Acid indigestion 04/21/2015  . Esophageal reflux 04/21/2015  . Low back pain 02/18/2015  . Abnormal weight gain 02/18/2015  . Anemia 02/17/2015  . History of colonic polyps 02/17/2015  . Cystocele 02/17/2015  . Depression 02/17/2015  . Fibroids 02/17/2015  . Panic attacks 02/17/2015  . Tendinitis of elbow or forearm 02/17/2015    Past Surgical History:  Procedure Laterality Date  . colon polyp removal  2010   colonoscopy  . COLONOSCOPY    . COLONOSCOPY WITH PROPOFOL N/A 03/01/2016   Procedure: COLONOSCOPY WITH PROPOFOL;  Surgeon: Jonathon Bellows, MD;  Location: [facility] ENDOSCOPY;  Service: Endoscopy;  Laterality: N/A;  . COLONOSCOPY WITH PROPOFOL N/A 09/20/2016   Procedure: COLONOSCOPY WITH PROPOFOL;  Surgeon: Lucilla Lame, MD;  Location: [place];  Service: Gastroenterology;  Laterality: N/A;  . Mammogram Screening  03/2011   [facility]  . pap smear  03/17/2012   [facility]  . POLYPECTOMY N/A 09/20/2016   Procedure: POLYPECTOMY;  Surgeon: Lucilla Lame, MD;  Location: [place];  Service: Gastroenterology;  Laterality: N/A;     No current facility-administered medications for this encounter.   Current Outpatient Medications:  .  meloxicam (MOBIC) 15 MG tablet, Take 1 tablet (15 mg total) by mouth daily as needed.,  Disp: 30 tablet, Rfl: 0  Allergies Patient has no known allergies.  Family History  Problem Relation Age of Onset  . Congestive Heart Failure Mother   . Heart attack Mother        1st MI in her 14's  . Cirrhosis Father        of liver  . Alcohol abuse Father   . Diabetes Maternal Aunt     Social History Social History   Tobacco Use  . Smoking status: Never Smoker  . Smokeless tobacco: Never Used  Substance Use Topics  . Alcohol use: No    Alcohol/week: 0.0 standard drinks  . Drug use: No    Review of Systems Constitutional: No  fever Cardiovascular: Denies chest pain. Respiratory: Denies shortness of breath. Gastrointestinal: No abdominal pain.   Musculoskeletal: Negative for back pain.as above.  ____________________________________________   PHYSICAL EXAM:  VITAL SIGNS: ED Triage Vitals  Enc Vitals Group     BP 12/30/17 1401 (!) 133/94     Pulse Rate 12/30/17 1401 66     Resp 12/30/17 1401 18     Temp 12/30/17 1401 98.6 F (37 C)     Temp Source 12/30/17 1401 Oral     SpO2 12/30/17 1401 98 %     Weight 12/30/17 1400 211 lb (95.7 kg)     Height 12/30/17 1400 5\' 4"  (1.626 m)     Head Circumference --      Peak Flow --      Pain Score 12/30/17 1359 5     Pain Loc --      Pain Edu? --      Excl. in [place]? --     Constitutional: Alert and oriented. Well appearing and in no acute distress. ENT      Head: Normocephalic and atraumatic. Cardiovascular: Normal rate, regular rhythm. Grossly normal heart sounds.  Good peripheral circulation. Respiratory: Normal respiratory effort without tachypnea nor retractions. Breath sounds are clear and equal bilaterally. No wheezes, rales, rhonchi. Musculoskeletal:  Bilateral pedal pulses equal and easily palpated.  Mild tenderness to proximal phalanx of the right third toe with minimal lines localized edema and ecchymosis, right foot otherwise nontender with full range of motion present, normal sensation.  Steady gait. Neurologic:  Normal speech and language.  Skin:  Skin is warm, dry and intact. No rash noted. Psychiatric: Mood and affect are normal. Speech and behavior are normal. Patient exhibits appropriate insight and judgment   ___________________________________________   LABS (all labs ordered are listed, but only abnormal results are displayed)  Labs Reviewed - No data to display ____________________________________________  PROCEDURES Procedures  EXAM: RIGHT FOOT COMPLETE - 3+ VIEW  COMPARISON:  07/25/2012  FINDINGS: There is an acute fracture  of the third proximal phalanx, associated with minimal displacement. There is soft tissue swelling along the anterior aspect of the foot. Mild degenerative change at the first metatarsophalangeal joint.  IMPRESSION: Minimally displaced fracture of the third proximal phalanx.   Electronically Signed   By: Nolon Nations M.D.   On: 11/28/2017 13:26   INITIAL IMPRESSION / ASSESSMENT AND PLAN / ED COURSE  Pertinent labs & imaging results that were available during my care of the patient were reviewed by me and considered in my medical decision making (see chart for details).  Well-appearing patient.  Known fracture to the right third toe, suspect overall well healing, but as patient continues to work long hours this intermittently aggravates the area.  Recommend for patient to buddy  tape second and third toes as well as wear flat good supportive shoes with toe room as compared to heels.  Recommend follow-up with occupational health in 1 week.  Also to take her Mobic previously prescribed.Inofrmation given.   Discussed follow up with Primary care physician this week as needed. Discussed follow up and return parameters including no resolution or any worsening concerns. Patient verbalized understanding and agreed to plan.   ____________________________________________   FINAL CLINICAL IMPRESSION(S) / ED DIAGNOSES  Final diagnoses:  Closed fracture of phalanx of right third toe with routine healing, subsequent encounter     ED Discharge Orders    None       Note: This dictation was prepared with Dragon dictation along with smaller phrase technology. Any transcriptional errors that result from this process are unintentional.         Marylene Land, NP 12/30/17 1438

## 2017-12-30 NOTE — Discharge Instructions (Signed)
Continue medication as prescribed. Buddy tape. Ice. Rest. Flat shoes.   Follow-up with occupational health, see above, in 1 week for follow-up.  Call today or tomorrow to schedule.  Follow up with your primary care physician this week as needed. Return to Urgent care for new or worsening concerns.

## 2017-12-30 NOTE — ED Triage Notes (Signed)
Patient complains of right 3rd toe pain that she fractured while at work on 11/17/2017 and was seen here on 11/28/2017 for work related injury. Patient states that she has not noticed any improvement and stopped wearing post op shoe.

## 2018-02-18 DIAGNOSIS — I10 Essential (primary) hypertension: Secondary | ICD-10-CM | POA: Insufficient documentation

## 2018-07-04 ENCOUNTER — Ambulatory Visit: Payer: Self-pay | Admitting: *Deleted

## 2018-07-04 NOTE — Telephone Encounter (Signed)
Pt called with having a cough and night sweats. She also c/o of a headache and body aches. Advised to take Tylenol for the headache and body aches. She has not been in contact with anyone that she knows of that has been positive. But she works with the public.  Advised that she would need a virtual visit with her provider and they would give the order for the covid-19 test. If she does not have a pcp, she can to an evisit thru [employer] site.  She voiced understanding. Advised that she should be mindful of other symptoms also and to go to the hospital for respiratory distress. She voiced understanding and will do an evisit.  Reason for Disposition . [1] COVID-19 infection suspected by caller or triager AND [2] mild symptoms (cough, fever, or others) AND [1] no complications or SOB  Answer Assessment - Initial Assessment Questions 1. COVID-19 DIAGNOSIS: "Who made your Coronavirus (COVID-19) diagnosis?" "Was it confirmed by a positive lab test?" If not diagnosed by a HCP, ask "Are there lots of cases (community spread) where you live?" (See public health department website, if unsure)     In the community 2. ONSET: "When did the COVID-19 symptoms start?"      Last night 3. WORST SYMPTOM: "What is your worst symptom?" (e.g., cough, fever, shortness of breath, muscle aches)     Cough, body aches, headaches 4. COUGH: "Do you have a cough?" If so, ask: "How bad is the cough?"       yes 5. FEVER: "Do you have a fever?" If so, ask: "What is your temperature, how was it measured, and when did it start?"     no 6. RESPIRATORY STATUS: "Describe your breathing?" (e.g., shortness of breath, wheezing, unable to speak)      no 7. BETTER-SAME-WORSE: "Are you getting better, staying the same or getting worse compared to yesterday?"  If getting worse, ask, "In what way?"     Worst  8. HIGH RISK DISEASE: "Do you have any chronic medical problems?" (e.g., asthma, heart or lung disease, weak immune system,  etc.)     no 9. PREGNANCY: "Is there any chance you are pregnant?" "When was your last menstrual period?"     N/a 10. OTHER SYMPTOMS: "Do you have any other symptoms?"  (e.g., chills, fatigue, headache, loss of smell or taste, muscle pain, sore throat)       Headache, muscle pain, chills and night sweats  Protocols used: CORONAVIRUS (COVID-19) DIAGNOSED OR SUSPECTED-A-AH

## 2018-07-08 ENCOUNTER — Encounter: Payer: Self-pay | Admitting: Family Medicine

## 2018-07-08 ENCOUNTER — Other Ambulatory Visit: Payer: Self-pay

## 2018-07-08 ENCOUNTER — Encounter: Payer: BLUE CROSS/BLUE SHIELD | Admitting: Family Medicine

## 2018-07-08 NOTE — Progress Notes (Deleted)
       Patient: Norma Bryant Female    DOB: December 11, 1955   63 y.o.   MRN: [id number] Visit Date: 07/08/2018  Today's Provider: Lelon Huh, MD   Chief Complaint  Patient presents with  . URI   Subjective:     URI  This is a new problem. Associated symptoms include coughing and wheezing.    No Known Allergies  No current outpatient medications on file.  Review of Systems  Constitutional: Positive for fever.  Respiratory: Positive for cough and wheezing.     Social History   Tobacco Use  . Smoking status: Never Smoker  . Smokeless tobacco: Never Used  Substance Use Topics  . Alcohol use: No    Alcohol/week: 0.0 standard drinks      Objective:   There were no vitals taken for this visit. There were no vitals filed for this visit.   Physical Exam   No results found for any visits on 07/08/18.     Assessment & Plan        Lelon Huh, MD  [facility]

## 2018-07-28 NOTE — Patient Instructions (Signed)
.   Please review the attached list of medications and notify my office if there are any errors.   . Please bring all of your medications to every appointment so we can make sure that our medication list is the same as yours.   . We will have flu vaccines available after Labor Day. Please go to your pharmacy or call the office in early September to schedule you flu shot.   

## 2019-05-30 IMAGING — CR DG FOOT COMPLETE 3+V*R*
3 series · 3 of 3 positions shown · non-contrast
Comparison: 07/25/2012

CLINICAL DATA: Pt states she dropped picture frame on top of 2nd
and 3rd toe at MCP area today. Hx of shaved bone pinky toe years ago

EXAM:
RIGHT FOOT COMPLETE - 3+ VIEW

[foot ap]
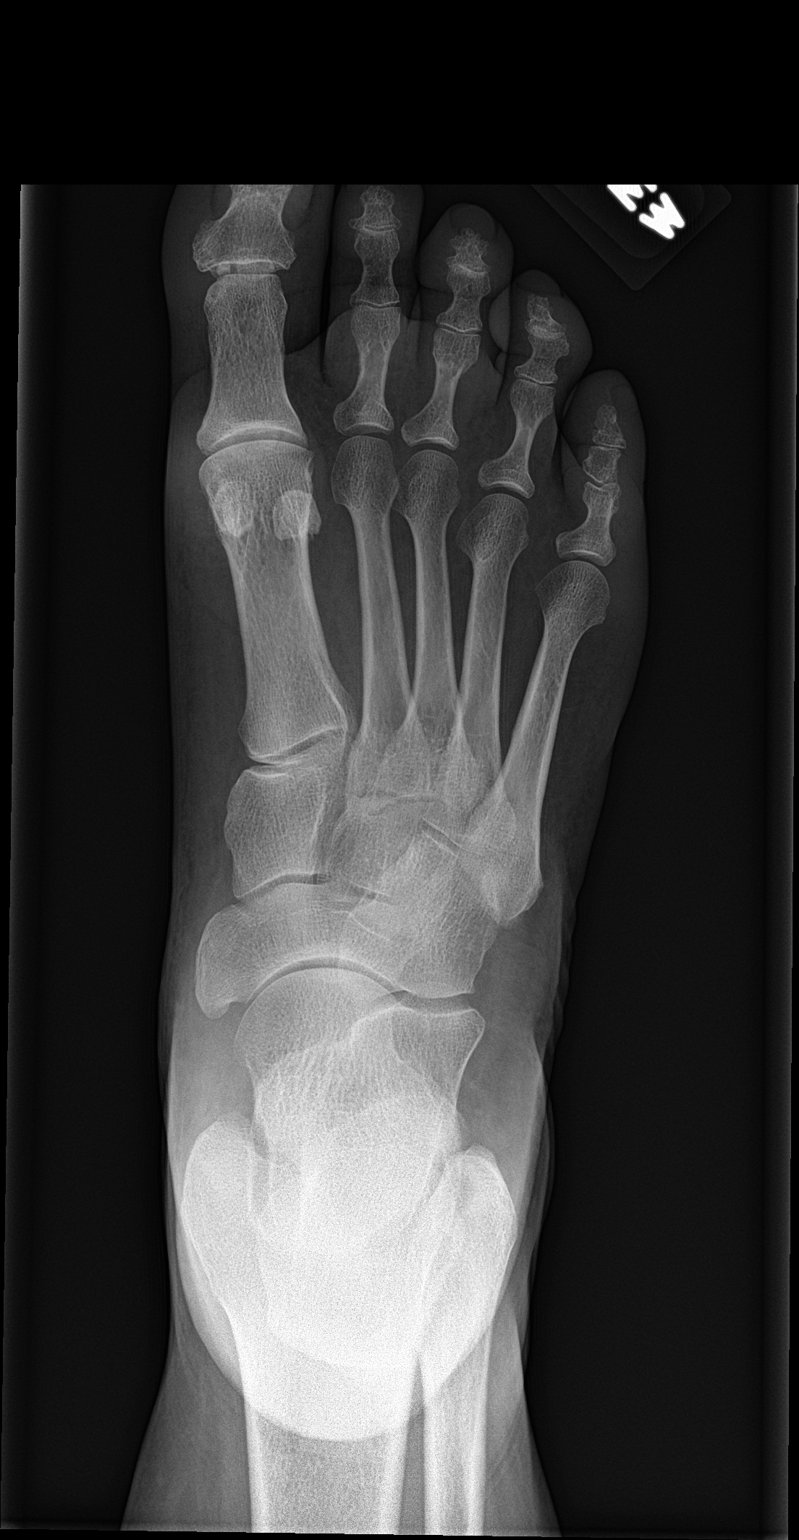

[foot obl]
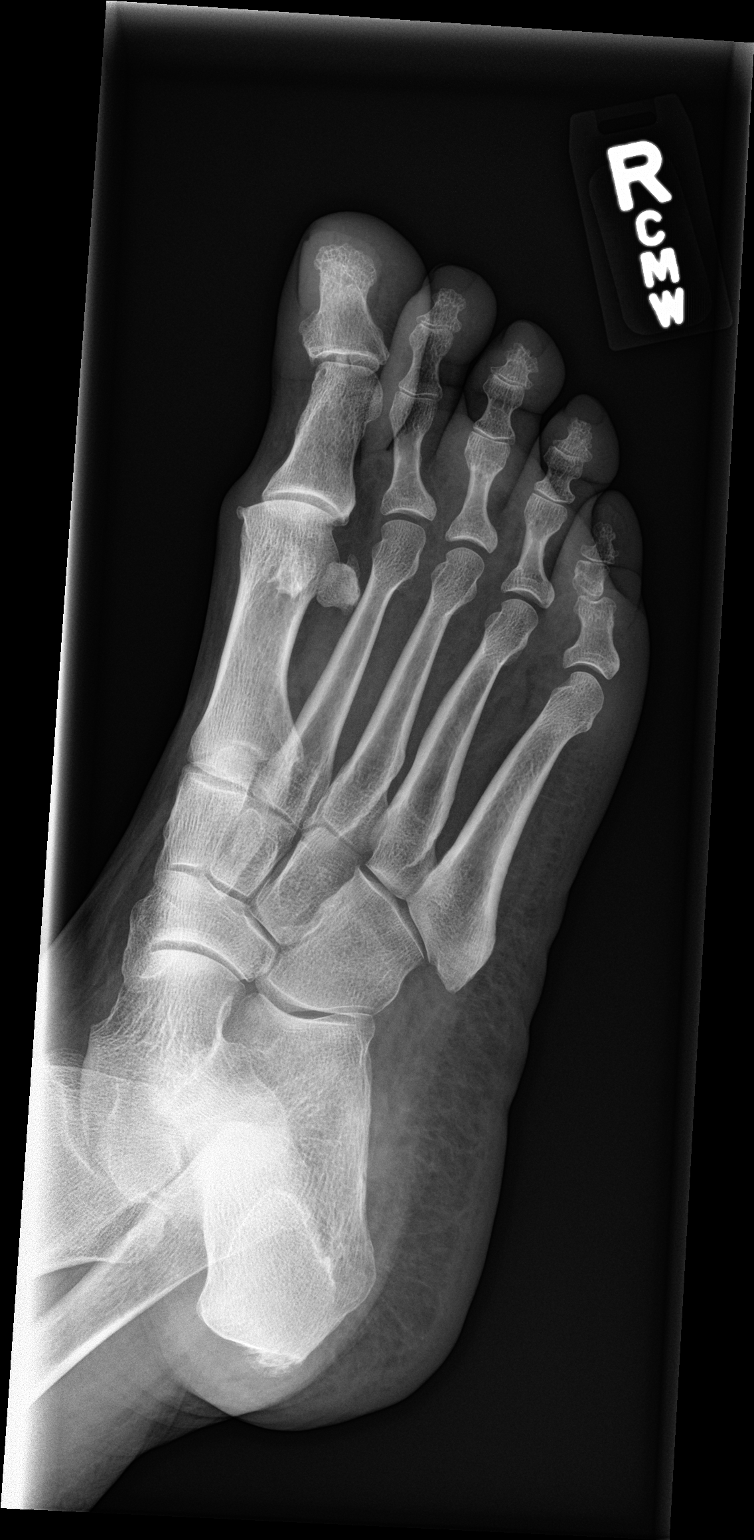

[foot lat]
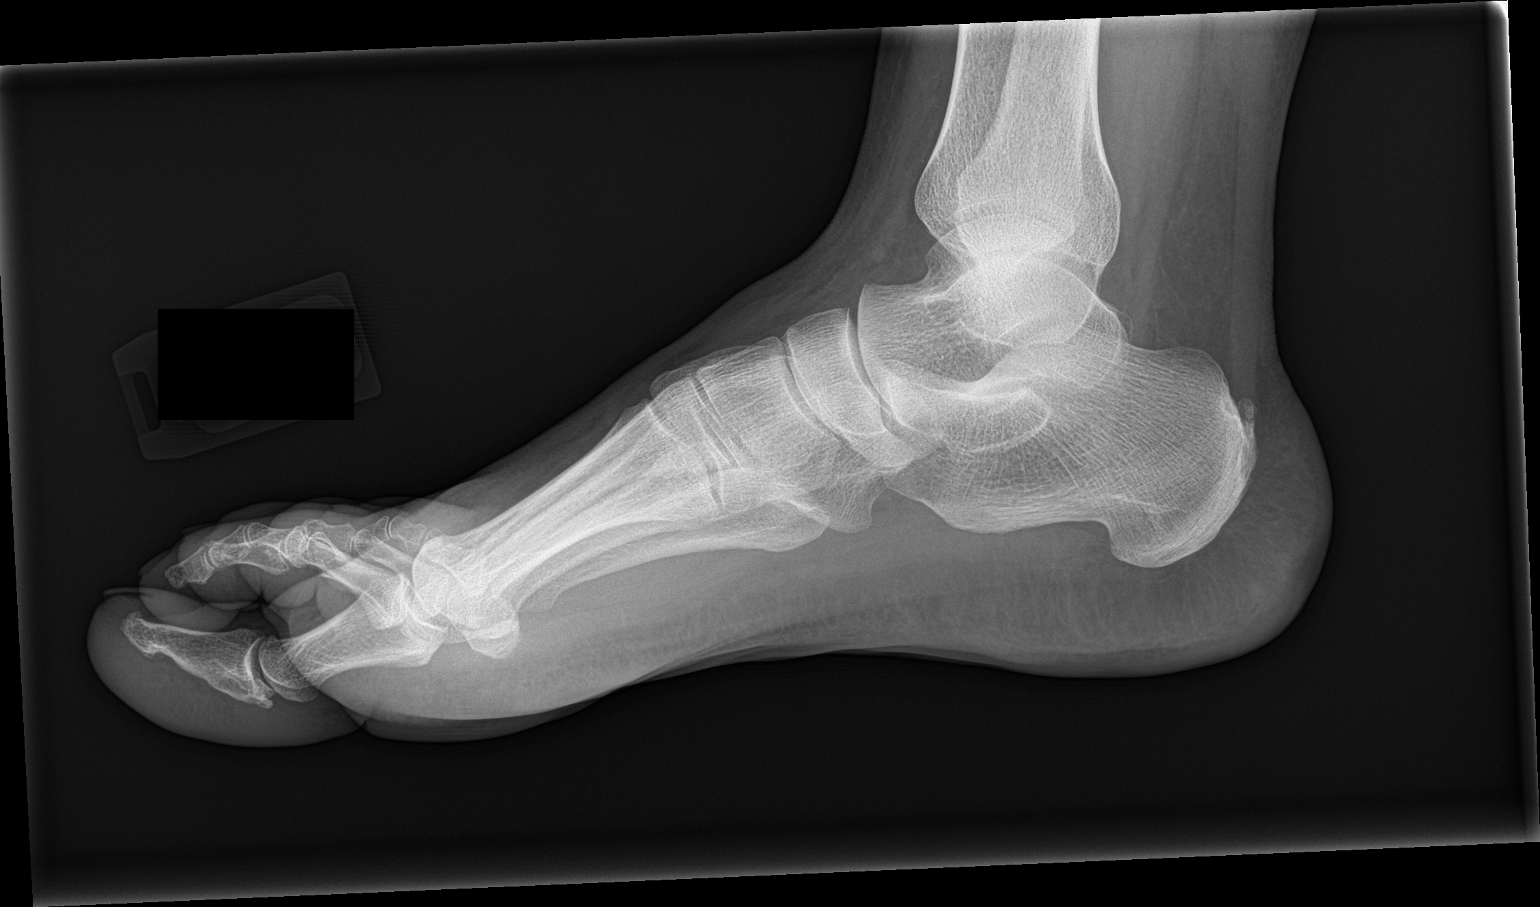

[3 of 3 positions shown; findings below may reference images not displayed]

FINDINGS: There is an acute fracture of the third proximal phalanx, associated
with minimal displacement. There is soft tissue swelling along the
anterior aspect of the foot. Mild degenerative change at the first
metatarsophalangeal joint.
IMPRESSION: Minimally displaced fracture of the third proximal phalanx.

## 2022-04-20 DIAGNOSIS — E782 Mixed hyperlipidemia: Secondary | ICD-10-CM | POA: Insufficient documentation

## 2022-08-06 ENCOUNTER — Ambulatory Visit: Payer: Self-pay | Admitting: Internal Medicine

## 2022-08-06 VITALS — BP 117/83 | HR 75 | Resp 14 | Ht 64.0 in | Wt 230.0 lb

## 2022-08-06 DIAGNOSIS — E669 Obesity, unspecified: Secondary | ICD-10-CM | POA: Diagnosis not present

## 2022-08-06 DIAGNOSIS — R0683 Snoring: Secondary | ICD-10-CM | POA: Insufficient documentation

## 2022-08-06 NOTE — Progress Notes (Signed)
Sleep Medicine   Office Visit  Patient Name: Norma Bryant DOB: [birth year] MRN [id number]    Chief Complaint: sleep evaluation  Brief History:  Cattleya presents with a 2 year history of snoring and frequent awakenings.   Sleep quality is poor. This is noted most nights. The patient's bed partner reports  snoring at night. The patient relates the following symptoms: snoring and morning headaches are also present. The patient goes to sleep at 10 pm and wakes up at 8:30 am.  Sleep quality is better when outside home environment.  Patient has noted restlessness of her legs at night.  The patient  relates no unusual behavior during the night.  The patient denies a history of psychiatric problems. The Epworth Sleepiness Score is 4 out of 24 .  The patient relates  Cardiovascular risk factors include: hypertension.     ROS  General: (-) fever, (-) chills, (-) night sweat Nose and Sinuses: (-) nasal stuffiness or itchiness, (-) postnasal drip, (-) nosebleeds, (-) sinus trouble. Mouth and Throat: (-) sore throat, (-) hoarseness. Neck: (-) swollen glands, (-) enlarged thyroid, (-) neck pain. Respiratory: - cough, - shortness of breath, - wheezing. Neurologic: - numbness, - tingling. Psychiatric: - anxiety, - depression Sleep behavior: -sleep paralysis -hypnogogic hallucinations -dream enactment      -vivid dreams -cataplexy -night terrors -sleep walking   Current Medication: Outpatient Encounter Medications as of 08/06/2022  Medication Sig   famotidine (PEPCID) 40 MG tablet Take by mouth.   hydrochlorothiazide (HYDRODIURIL) 25 MG tablet Take 25 mg by mouth daily.   No facility-administered encounter medications on file as of 08/06/2022.    Surgical History: Past Surgical History:  Procedure Laterality Date   colon polyp removal  2010   colonoscopy   COLONOSCOPY     COLONOSCOPY WITH PROPOFOL N/A 03/01/2016   Procedure: COLONOSCOPY WITH PROPOFOL;  Surgeon: Wyline Mood, MD;  Location: [facility]  ENDOSCOPY;  Service: Endoscopy;  Laterality: N/A;   COLONOSCOPY WITH PROPOFOL N/A 09/20/2016   Procedure: COLONOSCOPY WITH PROPOFOL;  Surgeon: Midge Minium, MD;  Location: [facility];  Service: Gastroenterology;  Laterality: N/A;   Mammogram Screening  03/2011   [facility]   pap smear  03/17/2012   [facility]   POLYPECTOMY N/A 09/20/2016   Procedure: POLYPECTOMY;  Surgeon: Midge Minium, MD;  Location: [facility];  Service: Gastroenterology;  Laterality: N/A;    Medical History: Past Medical History:  Diagnosis Date   Anemia    Colon polyp    Depression    History of chicken pox    History of measles    History of mumps    Panic attack    Vertigo    no episodes over 1 yr    Family History: Non contributory to the present illness  Social History: Social History   Socioeconomic History   Marital status: Divorced    Spouse name: Not on file   Number of children: 4   Years of education: Not on file   Highest education level: Not on file  Occupational History   Occupation: Physicist, medical associate    Comment: Works for [employer]  Tobacco Use   Smoking status: Never   Smokeless tobacco: Never  Substance and Sexual Activity   Alcohol use: No    Alcohol/week: 0.0 standard drinks of alcohol   Drug use: No   Sexual activity: Not on file  Other Topics Concern   Not on file  Social History Narrative   Not on file  Social Determinants of Health   Financial Resource Strain: Not on file  Food Insecurity: Food Insecurity Present (09/07/2019)   Received from [facility]   Hunger Vital Sign    Worried About Running Out of Food in the Last Year: Sometimes true    Ran Out of Food in the Last Year: Sometimes true  Transportation Needs: Not on file  Physical Activity: Not on file  Stress: Not on file  Social Connections: Not on file  Intimate Partner Violence: Not on file    Vital Signs: Blood pressure 117/83, pulse 75, resp. rate 14, height 5\' 4"  (1.626 m), weight  230 lb (104.3 kg), SpO2 97%. Body mass index is 39.48 kg/m.   Examination: General Appearance: The patient is well-developed, well-nourished, and in no distress. Neck Circumference: 38 cm Skin: Gross inspection of skin unremarkable. Head: normocephalic, no gross deformities. Eyes: no gross deformities noted. ENT: ears appear grossly normal Neurologic: Alert and oriented. No involuntary movements.    STOP BANG RISK ASSESSMENT S (snore) Have you been told that you snore?     YES   T (tired) Are you often tired, fatigued, or sleepy during the day?   NO  O (obstruction) Do you stop breathing, choke, or gasp during sleep? YES   P (pressure) Do you have or are you being treated for high blood pressure? YES   B (BMI) Is your body index greater than 35 kg/m? YES   A (age) Are you 65 years old or older? YES   N (neck) Do you have a neck circumference greater than 16 inches?   NO   G (gender) Are you a female? NO   TOTAL STOP/BANG "YES" ANSWERS 5                                                               A STOP-Bang score of 2 or less is considered low risk, and a score of 5 or more is high risk for having either moderate or severe OSA. For people who score 3 or 4, doctors may need to perform further assessment to determine how likely they are to have OSA.         EPWORTH SLEEPINESS SCALE:  Scale:  (0)= no chance of dozing; (1)= slight chance of dozing; (2)= moderate chance of dozing; (3)= high chance of dozing  Chance  Situtation    Sitting and reading: 1    Watching TV: 1    Sitting Inactive in public: 0    As a passenger in car: 0      Lying down to rest: 2    Sitting and talking: 0    Sitting quielty after lunch: 0    In a car, stopped in traffic: 0   TOTAL SCORE:   4 out of 24    SLEEP STUDIES:  No prior sleep study   LABS: No results found for this or any previous visit (from the past 2160 hour(s)).  Radiology: No results found.  No results  found.  No results found.    Assessment and Plan: Patient Active Problem List   Diagnosis Date Noted   Snoring 08/06/2022   Obesity (BMI 30-39.9) 08/06/2022   Mixed hyperlipidemia 04/20/2022   Essential hypertension 02/18/2018   Screening for malignant  neoplasm of respiratory organ    Polyp of sigmoid colon    Arthritis 04/21/2015   Acid indigestion 04/21/2015   Esophageal reflux 04/21/2015   Low back pain 02/18/2015   Abnormal weight gain 02/18/2015   Anemia 02/17/2015   History of colonic polyps 02/17/2015   Cystocele 02/17/2015   Depression 02/17/2015   Fibroids 02/17/2015   Panic attacks 02/17/2015   Tendinitis of elbow or forearm 02/17/2015   1. Snoring Patient evaluation suggests high risk of sleep disordered breathing due to snoring, frequent awakenings, obesity.  Patient has comorbid cardiovascular risk factors including: hypertension  which could be exacerbated by pathologic sleep-disordered breathing.  Suggest: PSG  to assess/treat the patient's sleep disordered breathing. The patient was also counselled on weight loss to optimize sleep health.  2. Obesity (BMI 30-39.9) Obesity Counseling: Had a lengthy discussion regarding patients BMI and weight issues. Patient was instructed on portion control as well as increased activity. Also discussed caloric restrictions with trying to maintain intake less than 2000 Kcal. Discussions were made in accordance with the 5As of weight management. Simple actions such as not eating late and if able to, taking a walk is suggested.      General Counseling: I have discussed the findings of the evaluation and examination with Rolena.  I have also discussed any further diagnostic evaluation thatmay be needed or ordered today. Laneya verbalizes understanding of the findings of todays visit. We also reviewed her medications today and discussed drug interactions and side effects including but not limited excessive drowsiness and altered mental  states. We also discussed that there is always a risk not just to her but also people around her. she has been encouraged to call the office with any questions or concerns that should arise related to todays visit.  No orders of the defined types were placed in this encounter.       I have personally obtained a history, evaluated the patient, evaluated pertinent data, formulated the assessment and plan and placed orders.   This patient was seen today by Emmaline Kluver, PA-C in collaboration with Dr. Freda Munro.   Yevonne Pax, MD [facility] Diplomate ABMS Pulmonary and Critical Care Medicine Sleep medicine

## 2022-09-09 ENCOUNTER — Encounter (INDEPENDENT_AMBULATORY_CARE_PROVIDER_SITE_OTHER): Payer: Medicare HMO | Admitting: Internal Medicine

## 2022-09-09 DIAGNOSIS — G4719 Other hypersomnia: Secondary | ICD-10-CM | POA: Diagnosis not present

## 2022-09-09 DIAGNOSIS — G4733 Obstructive sleep apnea (adult) (pediatric): Secondary | ICD-10-CM | POA: Diagnosis not present

## 2022-09-25 NOTE — Procedures (Signed)
[facility]  Polysomnogram Report Part I                                                               Phone: [phone] Fax: [phone]  Patient Name: Norma Bryant, Norma Bryant. Acquisition Number: [id number]  Date of Birth: 11-05-1955 Acquisition Date: 09/09/2022  Referring Physician: Isabell Jarvis, PA     History: The patient is a 67 year old  who was referred for evaluation of possible sleep apnea with snoring and excessive daytime sleepiness. Medical History: Essential hypertension, mixed hyperlipidemia, anemia, GERD, obesity, arthritis, low back pain, depression.  Medications: famotidine, HCTZ.  Procedure: This routine overnight polysomnogram was performed on the [facility] 5 using the standard diagnostic protocol. This included 6 channels of EEG, 2 channels of EOG, chin EMG, bilateral anterior tibialis EMG, nasal/oral thermistor, PTAF (nasal pressure transducer), chest and abdominal wall movements, EKG, and pulse oximetry.  Description: The total recording time was 446.6 minutes. The total sleep time was 369.5 minutes. There were a total of 46.3 minutes of wakefulness after sleep onset for a reducedsleep efficiency of 82.7%. The latency to sleep onset was slightly prolonged at 30.8 minutes. The R sleep onset latency was within normal limits at 93.0 minutes. Sleep parameters, as a percentage of the total sleep time, demonstrated 6.0% of sleep was in N1 sleep, 74.3% N2, 0.0% N3 and 19.8% R sleep. There were a total of 61 arousals for an arousal index of 9.9 arousals per hour of sleep that was normal.  Respiratory monitoring demonstrated   snoring . There were 68 apneas and hypopneas for an Apnea Hypopnea Index of 11.0 apneas and hypopneas per hour of sleep. The REM related apnea hypopnea index was 37.8/hr of REM sleep compared to a NREM AHI of 4.5/hr.  The average duration of the respiratory events was 30.8 seconds with a maximum duration of 70.5 seconds. The respiratory events occurred in  most positions. The respiratory events were associated with peripheral oxygen desaturations on the average to 84%. The lowest oxygen desaturation associated with a respiratory event was 66%. Additionally, the baseline oxygen saturation during wakefulness was 96%, during NREM sleep averaged 96%, and during REM sleep averaged 93%. The total duration of oxygen < 90% was 14.5 minutes and <80% was 3.9 minutes.  Cardiac monitoring-  demonstrate transient cardiac decelerations associated with the apneas.  significant cardiac rhythm irregularities.   Periodic limb movement monitoring- demonstrated that there were 227 periodic limb movements for a periodic limb movement index of 36.9 periodic limb movements per hour of sleep.     Impression: This routine overnight polysomnogram demonstrated predominately REM dependent obstructive sleep apnea. The overall Apnea Hypopnea Index was 11.0 apneas and hypopneas per hour of sleep, which predominately occurred during REM, 37.8/hr. The respiratory events were associated with peripheral oxygen desaturations on the average to 84% with more profound desaturations during REM. The lowest oxygen desaturation associated with a respiratory event was 66%.  There was a significantly elevated periodic limb movement index of 36.9 periodic limb movements per hour of sleep. Sometimes these limb movements subside once the apnea is controlled. Clinical correlation would be suggested.  reduced sleep efficiency due to a long latency to sustained sleep and increased awakenings, with reduced REM and absence of N3 sleep.  These findings would appear to be due to the obstructive sleep apnea, periodic limb movements and first night effect of sleep in the lab.   Recommendations:  A CPAP titration would be recommended due to the severity of the sleep apnea. Would recommend weight loss in a patient with a BMI of 39.5 lb/in2 Additionally, would recommend evaluation for possible restless leg  syndrome or periodic limb movement disorder with frequent periodic limb movements.     Yevonne Pax, MD [facility] Diplomate ABMS Pulmonary Critical Care and Sleep Medicine Electronically reviewed and digitally signed  [facility] Polysomnogram Report Part II  Phone: [phone] Fax: [phone]  Patient last name Lowenthal Neck Size 15.0 in. Acquisition [id number]  Patient first name Norma A. Weight 230.0 lbs. Started 09/09/2022 at 9:42:57 PM  Birth date 03-03-1955 Height 64.0 in. Stopped 09/10/2022 at 5:19:15 AM  Age 76 BMI 39.5 lb/in2 Duration 446.6  Study Type Adult      Rockie Neighbours, RPSGT  Reviewed by: Valentino Hue. Henke, PhD, ABSM, FAASM Sleep Data: Lights Out: 9:49:09 PM Sleep Onset: 10:19:57 PM  Lights On: 5:15:45 AM Sleep Efficiency: 82.7 %  Total Recording Time: 446.6 min Sleep Latency (from Lights Off) 30.8 min  Total Sleep Time (TST): 369.5 min R Latency (from Sleep Onset): 93.0 min  Sleep Period Time: 415.5 min Total number of awakenings: 20  Wake during sleep: 46.0 min Wake After Sleep Onset (WASO): 46.3 min   Sleep Data:         Arousal Summary: Stage  Latency from lights out (min) Latency from sleep onset (min) Duration (min) % Total Sleep Time  Normal values  N 1 30.8 0.0 22.0 6.0 (5%)  N 2 55.3 24.5 274.5 74.3 (50%)  N 3       0.0 0.0 (20%)  R 123.8 93.0 73.0 19.8 (25%)   Number Index  Spontaneous 41 6.7  Apneas & Hypopneas 6 1.0  RERAs 0 0.0       (Apneas & Hypopneas & RERAs)  (6) (1.0)  Limb Movement 14 2.3  Snore 0 0.0  TOTAL 61 9.9     Respiratory Data:  CA OA MA Apnea Hypopnea* A+ H RERA Total  Number 0 22 0 22 46 68 0 68  Mean Dur (sec) 0.0 23.4 0.0 23.4 34.4 30.8 0.0 30.8  Max Dur (sec) 0.0 52.5 0.0 52.5 70.5 70.5 0.0 70.5  Total Dur (min) 0.0 8.6 0.0 8.6 26.4 35.0 0.0 35.0  % of TST 0.0 2.3 0.0 2.3 7.1 9.5 0.0 9.5  Index (#/h TST) 0.0 3.6 0.0 3.6 7.5 11.0 0.0 11.0  *Hypopneas scored based on 4% or greater desaturation.  Sleep Stage:         REM NREM TST  AHI 37.8 4.5 11.0  RDI 37.8 4.5 11.0           Body Position Data:  Sleep (min) TST (%) REM (min) NREM (min) CA (#) OA (#) MA (#) HYP (#) AHI (#/h) RERA (#) RDI (#/h) Desat (#)  Supine 81.1 21.95 33.5 47.6 0 6 0 11 12.6 0 12.6 52  Left: 24.0 6.50 0.0 24.0 0 0 0 0 0.0 0 0.00 6  Right: 264.4 71.56 39.5 224.9 0 16 0 35 11.6 0 11.6 175     Snoring: Total number of snoring episodes  0  Total time with snoring    min (   % of sleep)   Oximetry Distribution:  WK REM NREM TOTAL  Average (%)   96 93 96 96  < 90% 0.1 13.9 0.5 14.5  < 80% 0.0 3.9 0.0 3.9  < 70% 0.0 0.2 0.0 0.2  # of Desaturations* 4 93 136 233  Desat Index (#/hour) 3.1 76.4 29.1 39.5  Desat Max (%) 6 25 12 25   Desat Max Dur (sec) 34.0 71.0 114.0 114.0  Approx Min O2 during sleep 66  Approx min O2 during a respiratory event 66  Was Oxygen added (Y/N) and final rate :    LPM  *Desaturations based on 3% or greater drop from baseline.   Cheyne Stokes Breathing: None Present   Heart Rate Summary:  Average Heart Rate During Sleep 86.4 bpm      Highest Heart Rate During Sleep (95th %) 140.0 bpm      Highest Heart Rate During Sleep 185 bpm (artifact)  Highest Heart Rate During Recording (TIB) 185 bpm (artifact)   Heart Rate Observations: Event Type # Events   Bradycardia 0 Lowest HR Scored: N/A  Sinus Tachycardia During Sleep 0 Highest HR Scored: N/A  Narrow Complex Tachycardia 0 Highest HR Scored: N/A  Wide Complex Tachycardia 0 Highest HR Scored: N/A  Asystole 0 Longest Pause: N/A  Atrial Fibrillation 0 Duration Longest Event: N/A  Other Arrythmias   Type:    Periodic Limb Movement Data: (Primary legs unless otherwise noted) Total # Limb Movement 227 Limb Movement Index 36.9  Total # PLMS 227 PLMS Index 36.9  Total # PLMS Arousals 14 PLMS Arousal Index 2.3  Percentage Sleep Time with PLMS 121.72min (32.9 % sleep)  Mean Duration limb movements (secs) 1214.7
